# Patient Record
Sex: Female | Born: 1945 | Race: White | Hispanic: No | Smoking: Never smoker
Health system: Southern US, Community
[De-identification: ages and names within clinical notes are randomized; demographics above are authoritative.]

## PROBLEM LIST (undated history)

## (undated) DIAGNOSIS — Z8719 Personal history of other diseases of the digestive system: Secondary | ICD-10-CM

## (undated) DIAGNOSIS — F329 Major depressive disorder, single episode, unspecified: Secondary | ICD-10-CM

## (undated) DIAGNOSIS — I10 Essential (primary) hypertension: Secondary | ICD-10-CM

## (undated) DIAGNOSIS — M1990 Unspecified osteoarthritis, unspecified site: Secondary | ICD-10-CM

## (undated) DIAGNOSIS — F32A Depression, unspecified: Secondary | ICD-10-CM

## (undated) HISTORY — PX: ABDOMINAL HYSTERECTOMY: SHX81

## (undated) HISTORY — PX: TONSILLECTOMY: SUR1361

## (undated) HISTORY — PX: APPENDECTOMY: SHX54

---

## 1999-05-08 HISTORY — PX: OTHER SURGICAL HISTORY: SHX169

## 2016-09-21 ENCOUNTER — Ambulatory Visit: Payer: Self-pay | Admitting: Physician Assistant

## 2016-10-22 ENCOUNTER — Encounter (HOSPITAL_COMMUNITY)
Admission: RE | Admit: 2016-10-22 | Discharge: 2016-10-22 | Disposition: A | Discharge status: Home / Self Care | Payer: Medicare HMO | Source: Ambulatory Visit | Attending: Orthopedic Surgery | Admitting: Orthopedic Surgery

## 2016-10-22 ENCOUNTER — Encounter (HOSPITAL_COMMUNITY): Payer: Self-pay

## 2016-10-22 DIAGNOSIS — M48061 Spinal stenosis, lumbar region without neurogenic claudication: Secondary | ICD-10-CM | POA: Diagnosis not present

## 2016-10-22 DIAGNOSIS — F329 Major depressive disorder, single episode, unspecified: Secondary | ICD-10-CM | POA: Diagnosis not present

## 2016-10-22 DIAGNOSIS — K449 Diaphragmatic hernia without obstruction or gangrene: Secondary | ICD-10-CM | POA: Diagnosis not present

## 2016-10-22 DIAGNOSIS — M5136 Other intervertebral disc degeneration, lumbar region: Secondary | ICD-10-CM | POA: Insufficient documentation

## 2016-10-22 DIAGNOSIS — Z01812 Encounter for preprocedural laboratory examination: Secondary | ICD-10-CM | POA: Insufficient documentation

## 2016-10-22 DIAGNOSIS — I10 Essential (primary) hypertension: Secondary | ICD-10-CM | POA: Insufficient documentation

## 2016-10-22 HISTORY — DX: Major depressive disorder, single episode, unspecified: F32.9

## 2016-10-22 HISTORY — DX: Depression, unspecified: F32.A

## 2016-10-22 HISTORY — DX: Unspecified osteoarthritis, unspecified site: M19.90

## 2016-10-22 HISTORY — DX: Essential (primary) hypertension: I10

## 2016-10-22 HISTORY — DX: Personal history of other diseases of the digestive system: Z87.19

## 2016-10-22 LAB — BASIC METABOLIC PANEL
ANION GAP: 8 (ref 5–15)
BUN: 18 mg/dL (ref 6–20)
CALCIUM: 9.4 mg/dL (ref 8.9–10.3)
CHLORIDE: 108 mmol/L (ref 101–111)
CO2: 25 mmol/L (ref 22–32)
CREATININE: 1.07 mg/dL — AB (ref 0.44–1.00)
GFR calc non Af Amer: 51 mL/min — ABNORMAL LOW (ref >60)
GFR, EST AFRICAN AMERICAN: 60 mL/min — AB (ref >60)
GLUCOSE: 119 mg/dL — AB (ref 65–99)
Potassium: 4.2 mmol/L (ref 3.5–5.1)
Sodium: 141 mmol/L (ref 135–145)

## 2016-10-22 LAB — CBC
HCT: 42.6 % (ref 36.0–46.0)
HEMOGLOBIN: 13.8 g/dL (ref 12.0–15.0)
MCH: 32.1 pg (ref 26.0–34.0)
MCHC: 32.4 g/dL (ref 30.0–36.0)
MCV: 99.1 fL (ref 78.0–100.0)
Platelets: 237 10*3/uL (ref 150–400)
RBC: 4.3 MIL/uL (ref 3.87–5.11)
RDW: 14.2 % (ref 11.5–15.5)
WBC: 7.4 10*3/uL (ref 4.0–10.5)

## 2016-10-22 LAB — TYPE AND SCREEN
ABO/RH(D): A POS
Antibody Screen: NEGATIVE

## 2016-10-22 LAB — SURGICAL PCR SCREEN
MRSA, PCR: NEGATIVE
Staphylococcus aureus: NEGATIVE

## 2016-10-22 LAB — ABO/RH: ABO/RH(D): A POS

## 2016-10-22 NOTE — Pre-Procedure Instructions (Addendum)
Sierra Jones  10/22/2016      [facility] - [place] - [id number] [place] [id number] Phone: [phone] Fax: [phone]    Your procedure is scheduled on 10/31/16 .  [facility] Admitting at 530 A.M.  Call this number if you have problems the morning of surgery:  [phone]   Remember:  Do not eat food or drink liquids after midnight.  Take these medicines the morning of surgery with A SIP OF WATER     Atenolol, fluoxetine(prozac), lyrica  STOP all herbel meds, nsaids (aleve,naproxen,advil,ibuprofen) prior to surgery starting 10/24/16 including all vitamins/supplements,aspirin   Do not wear jewelry, make-up or nail polish.  Do not wear lotions, powders, or perfumes, or deoderant.  Do not shave 48 hours prior to surgery.  Men may shave face and neck.  Do not bring valuables to the hospital.  [facility] is not responsible for any belongings or valuables.  Contacts, dentures or bridgework may not be worn into surgery.  Leave your suitcase in the car.  After surgery it may be brought to your room.  For patients admitted to the hospital, discharge time will be determined by your treatment team.  Patients discharged the day of surgery will not be allowed to drive home.   Special instructions:   Special Instructions: [facility] - Preparing for Surgery  Before surgery, you can play an important role.  Because skin is not sterile, your skin needs to be as free of germs as possible.  You can reduce the number of germs on you skin by washing with CHG (chlorahexidine gluconate) soap before surgery.  CHG is an antiseptic cleaner which kills germs and bonds with the skin to continue killing germs even after washing.  Please DO NOT use if you have an allergy to CHG or antibacterial soaps.  If your skin becomes reddened/irritated stop using the CHG and inform your nurse when you arrive at Short Stay.  Do not shave  (including legs and underarms) for at least 48 hours prior to the first CHG shower.  You may shave your face.  Please follow these instructions carefully:   1.  Shower with CHG Soap the night before surgery and the morning of Surgery.  2.  If you choose to wash your hair, wash your hair first as usual with your normal shampoo.  3.  After you shampoo, rinse your hair and body thoroughly to remove the Shampoo.  4.  Use CHG as you would any other liquid soap.  You can apply chg directly  to the skin and wash gently with scrungie or a clean washcloth.  5.  Apply the CHG Soap to your body ONLY FROM THE NECK DOWN.  Do not use on open wounds or open sores.  Avoid contact with your eyes ears, mouth and genitals (private parts).  Wash genitals (private parts)       with your normal soap.  6.  Wash thoroughly, paying special attention to the area where your surgery will be performed.  7.  Thoroughly rinse your body with warm water from the neck down.  8.  DO NOT shower/wash with your normal soap after using and rinsing off the CHG Soap.  9.  Pat yourself dry with a clean towel.            10.  Wear clean pajamas.            11.  Place clean sheets on your bed the night of your first shower and do not sleep with pets.  Day of Surgery  Do not apply any lotions/deodorants the morning of surgery.  Please wear clean clothes to the hospital/surgery center.  Please read over the  fact sheets that you were given.

## 2016-10-22 NOTE — Progress Notes (Signed)
req'd ekg done at pcp recently-[facility]

## 2016-10-31 ENCOUNTER — Encounter (HOSPITAL_COMMUNITY)
Admission: RE | Disposition: A | Discharge status: Home / Self Care | Payer: Self-pay | Source: Ambulatory Visit | Attending: Orthopedic Surgery

## 2016-10-31 ENCOUNTER — Inpatient Hospital Stay (HOSPITAL_COMMUNITY): Payer: Medicare HMO | Admitting: Certified Registered Nurse Anesthetist

## 2016-10-31 ENCOUNTER — Encounter (HOSPITAL_COMMUNITY): Payer: Self-pay | Admitting: Urology

## 2016-10-31 ENCOUNTER — Inpatient Hospital Stay (HOSPITAL_COMMUNITY): Payer: Medicare HMO

## 2016-10-31 ENCOUNTER — Inpatient Hospital Stay (HOSPITAL_COMMUNITY)
Admission: RE | Admit: 2016-10-31 | Discharge: 2016-11-02 | DRG: 460 | Disposition: A | Discharge status: Home / Self Care | Payer: Medicare HMO | Source: Ambulatory Visit | Attending: Orthopedic Surgery | Admitting: Orthopedic Surgery

## 2016-10-31 DIAGNOSIS — Z809 Family history of malignant neoplasm, unspecified: Secondary | ICD-10-CM | POA: Diagnosis not present

## 2016-10-31 DIAGNOSIS — M533 Sacrococcygeal disorders, not elsewhere classified: Secondary | ICD-10-CM | POA: Diagnosis present

## 2016-10-31 DIAGNOSIS — Z9889 Other specified postprocedural states: Secondary | ICD-10-CM

## 2016-10-31 DIAGNOSIS — M109 Gout, unspecified: Secondary | ICD-10-CM | POA: Diagnosis present

## 2016-10-31 DIAGNOSIS — M48062 Spinal stenosis, lumbar region with neurogenic claudication: Principal | ICD-10-CM | POA: Diagnosis present

## 2016-10-31 DIAGNOSIS — Z823 Family history of stroke: Secondary | ICD-10-CM | POA: Diagnosis not present

## 2016-10-31 DIAGNOSIS — I10 Essential (primary) hypertension: Secondary | ICD-10-CM | POA: Diagnosis present

## 2016-10-31 DIAGNOSIS — Z79891 Long term (current) use of opiate analgesic: Secondary | ICD-10-CM | POA: Diagnosis not present

## 2016-10-31 DIAGNOSIS — M549 Dorsalgia, unspecified: Secondary | ICD-10-CM | POA: Diagnosis present

## 2016-10-31 DIAGNOSIS — M5116 Intervertebral disc disorders with radiculopathy, lumbar region: Secondary | ICD-10-CM | POA: Diagnosis present

## 2016-10-31 DIAGNOSIS — M4316 Spondylolisthesis, lumbar region: Secondary | ICD-10-CM | POA: Diagnosis present

## 2016-10-31 DIAGNOSIS — Z8249 Family history of ischemic heart disease and other diseases of the circulatory system: Secondary | ICD-10-CM

## 2016-10-31 DIAGNOSIS — Z818 Family history of other mental and behavioral disorders: Secondary | ICD-10-CM | POA: Diagnosis not present

## 2016-10-31 DIAGNOSIS — M797 Fibromyalgia: Secondary | ICD-10-CM | POA: Diagnosis present

## 2016-10-31 DIAGNOSIS — F329 Major depressive disorder, single episode, unspecified: Secondary | ICD-10-CM | POA: Diagnosis present

## 2016-10-31 DIAGNOSIS — M4326 Fusion of spine, lumbar region: Secondary | ICD-10-CM

## 2016-10-31 DIAGNOSIS — M7062 Trochanteric bursitis, left hip: Secondary | ICD-10-CM | POA: Diagnosis present

## 2016-10-31 DIAGNOSIS — Z419 Encounter for procedure for purposes other than remedying health state, unspecified: Secondary | ICD-10-CM

## 2016-10-31 HISTORY — PX: TRANSFORAMINAL LUMBAR INTERBODY FUSION (TLIF) WITH PEDICLE SCREW FIXATION 1 LEVEL: SHX6141

## 2016-10-31 HISTORY — PX: LUMBAR LAMINECTOMY/DECOMPRESSION MICRODISCECTOMY: SHX5026

## 2016-10-31 SURGERY — TRANSFORAMINAL LUMBAR INTERBODY FUSION (TLIF) WITH PEDICLE SCREW FIXATION 1 LEVEL
Anesthesia: General | Site: Spine Lumbar

## 2016-10-31 MED ORDER — HEMOSTATIC AGENTS (NO CHARGE) OPTIME
TOPICAL | Status: DC | PRN
  Administered 2016-10-31: 1 via TOPICAL

## 2016-10-31 MED ORDER — SODIUM CHLORIDE 0.9 % IV SOLN: 250.0000 mL | INTRAVENOUS | Status: DC

## 2016-10-31 MED ORDER — SUGAMMADEX SODIUM 200 MG/2ML IV SOLN
INTRAVENOUS | Status: DC | PRN
  Administered 2016-10-31: 200 mg via INTRAVENOUS

## 2016-10-31 MED ORDER — PHENOL 1.4 % MT LIQD: 1.0000 | OROMUCOSAL | Status: DC | PRN

## 2016-10-31 MED ORDER — POLYETHYLENE GLYCOL 3350 17 G PO PACK: 17.0000 g | PACK | Freq: Every day | ORAL | Status: DC | PRN

## 2016-10-31 MED ORDER — THROMBIN 20000 UNITS EX SOLR
CUTANEOUS | Status: AC
  Filled 2016-10-31: qty 20000

## 2016-10-31 MED ORDER — LIDOCAINE HCL (CARDIAC) 20 MG/ML IV SOLN
INTRAVENOUS | Status: DC | PRN
  Administered 2016-10-31: 100 mg via INTRATRACHEAL

## 2016-10-31 MED ORDER — PROPOFOL 10 MG/ML IV BOLUS
INTRAVENOUS | Status: DC | PRN
  Administered 2016-10-31: 150 mg via INTRAVENOUS
  Administered 2016-10-31: 50 mg via INTRAVENOUS

## 2016-10-31 MED ORDER — LACTATED RINGERS IV SOLN: INTRAVENOUS | Status: DC

## 2016-10-31 MED ORDER — ONDANSETRON HCL 4 MG/2ML IJ SOLN: 4.0000 mg | Freq: Four times a day (QID) | INTRAMUSCULAR | Status: DC | PRN

## 2016-10-31 MED ORDER — PROPOFOL 500 MG/50ML IV EMUL
INTRAVENOUS | Status: DC | PRN
  Administered 2016-10-31: 75 ug/kg/min via INTRAVENOUS
  Administered 2016-10-31: 10:00:00 via INTRAVENOUS

## 2016-10-31 MED ORDER — ONDANSETRON HCL 4 MG PO TABS: 4.0000 mg | ORAL_TABLET | Freq: Four times a day (QID) | ORAL | Status: DC | PRN

## 2016-10-31 MED ORDER — ARTIFICIAL TEARS OPHTHALMIC OINT
TOPICAL_OINTMENT | OPHTHALMIC | Status: DC | PRN
  Administered 2016-10-31: 1 via OPHTHALMIC

## 2016-10-31 MED ORDER — SUCCINYLCHOLINE CHLORIDE 20 MG/ML IJ SOLN
INTRAMUSCULAR | Status: DC | PRN
  Administered 2016-10-31: 120 mg via INTRAVENOUS

## 2016-10-31 MED ORDER — FENTANYL CITRATE (PF) 250 MCG/5ML IJ SOLN
INTRAMUSCULAR | Status: AC
  Filled 2016-10-31: qty 5

## 2016-10-31 MED ORDER — MORPHINE SULFATE (PF) 4 MG/ML IV SOLN: 2.0000 mg | INTRAVENOUS | Status: AC | PRN

## 2016-10-31 MED ORDER — CEFAZOLIN SODIUM-DEXTROSE 2-4 GM/100ML-% IV SOLN
2.0000 g | INTRAVENOUS | Status: AC
  Administered 2016-10-31: 2 g via INTRAVENOUS
  Filled 2016-10-31: qty 100

## 2016-10-31 MED ORDER — DEXAMETHASONE SODIUM PHOSPHATE 10 MG/ML IJ SOLN
INTRAMUSCULAR | Status: AC
  Filled 2016-10-31: qty 1

## 2016-10-31 MED ORDER — MAGNESIUM CITRATE PO SOLN: 0.5000 | Freq: Once | ORAL | Status: DC | PRN

## 2016-10-31 MED ORDER — PHENYLEPHRINE HCL 10 MG/ML IJ SOLN
INTRAMUSCULAR | Status: DC | PRN
  Administered 2016-10-31: 120 ug via INTRAVENOUS

## 2016-10-31 MED ORDER — METHOCARBAMOL 500 MG PO TABS
500.0000 mg | ORAL_TABLET | Freq: Four times a day (QID) | ORAL | Status: DC | PRN
  Administered 2016-10-31 – 2016-11-02 (×3): 500 mg via ORAL
  Filled 2016-10-31 (×3): qty 1

## 2016-10-31 MED ORDER — ONDANSETRON HCL 4 MG/2ML IJ SOLN
INTRAMUSCULAR | Status: DC | PRN
  Administered 2016-10-31: 4 mg via INTRAVENOUS

## 2016-10-31 MED ORDER — ACETAMINOPHEN 325 MG PO TABS: 650.0000 mg | ORAL_TABLET | ORAL | Status: DC | PRN

## 2016-10-31 MED ORDER — MENTHOL 3 MG MT LOZG: 1.0000 | LOZENGE | OROMUCOSAL | Status: DC | PRN

## 2016-10-31 MED ORDER — FENTANYL CITRATE (PF) 100 MCG/2ML IJ SOLN
25.0000 ug | INTRAMUSCULAR | Status: DC | PRN
  Administered 2016-10-31: 50 ug via INTRAVENOUS

## 2016-10-31 MED ORDER — SUCCINYLCHOLINE CHLORIDE 200 MG/10ML IV SOSY
PREFILLED_SYRINGE | INTRAVENOUS | Status: AC
  Filled 2016-10-31: qty 10

## 2016-10-31 MED ORDER — ARTIFICIAL TEARS OPHTHALMIC OINT
TOPICAL_OINTMENT | OPHTHALMIC | Status: AC
  Filled 2016-10-31: qty 3.5

## 2016-10-31 MED ORDER — METHOCARBAMOL 1000 MG/10ML IJ SOLN
500.0000 mg | Freq: Four times a day (QID) | INTRAMUSCULAR | Status: DC | PRN
  Filled 2016-10-31: qty 5

## 2016-10-31 MED ORDER — ROCURONIUM BROMIDE 10 MG/ML (PF) SYRINGE
PREFILLED_SYRINGE | INTRAVENOUS | Status: AC
  Filled 2016-10-31: qty 5

## 2016-10-31 MED ORDER — FLUOXETINE HCL 20 MG PO CAPS
20.0000 mg | ORAL_CAPSULE | Freq: Two times a day (BID) | ORAL | Status: DC
  Administered 2016-10-31 – 2016-11-02 (×4): 20 mg via ORAL
  Filled 2016-10-31 (×6): qty 1

## 2016-10-31 MED ORDER — OXYCODONE HCL 5 MG PO TABS
ORAL_TABLET | ORAL | Status: AC
  Administered 2016-10-31: 10 mg via ORAL
  Filled 2016-10-31: qty 2

## 2016-10-31 MED ORDER — ATENOLOL 50 MG PO TABS
50.0000 mg | ORAL_TABLET | Freq: Every day | ORAL | Status: DC
  Administered 2016-11-01 – 2016-11-02 (×2): 50 mg via ORAL
  Filled 2016-10-31 (×2): qty 1

## 2016-10-31 MED ORDER — BUPIVACAINE-EPINEPHRINE 0.25% -1:200000 IJ SOLN
INTRAMUSCULAR | Status: DC | PRN
  Administered 2016-10-31: 10 mL

## 2016-10-31 MED ORDER — FENTANYL CITRATE (PF) 100 MCG/2ML IJ SOLN
INTRAMUSCULAR | Status: AC
  Filled 2016-10-31: qty 2

## 2016-10-31 MED ORDER — EPHEDRINE SULFATE 50 MG/ML IJ SOLN
INTRAMUSCULAR | Status: DC | PRN
  Administered 2016-10-31 (×3): 10 mg via INTRAVENOUS
  Administered 2016-10-31: 5 mg via INTRAVENOUS
  Administered 2016-10-31: 15 mg via INTRAVENOUS

## 2016-10-31 MED ORDER — ALBUMIN HUMAN 5 % IV SOLN
INTRAVENOUS | Status: DC | PRN
  Administered 2016-10-31 (×2): via INTRAVENOUS

## 2016-10-31 MED ORDER — SODIUM CHLORIDE 0.9% FLUSH: 3.0000 mL | INTRAVENOUS | Status: DC | PRN

## 2016-10-31 MED ORDER — ONDANSETRON HCL 4 MG/2ML IJ SOLN
INTRAMUSCULAR | Status: AC
  Filled 2016-10-31: qty 2

## 2016-10-31 MED ORDER — HYDROMORPHONE HCL 1 MG/ML IJ SOLN
INTRAMUSCULAR | Status: AC
  Filled 2016-10-31: qty 0.5

## 2016-10-31 MED ORDER — DEXAMETHASONE SODIUM PHOSPHATE 4 MG/ML IJ SOLN
4.0000 mg | Freq: Four times a day (QID) | INTRAMUSCULAR | Status: AC
  Administered 2016-10-31: 4 mg via INTRAVENOUS
  Filled 2016-10-31: qty 1

## 2016-10-31 MED ORDER — ACETAMINOPHEN 10 MG/ML IV SOLN
INTRAVENOUS | Status: DC | PRN
Start: 2016-10-31 — End: 2016-10-31
  Administered 2016-10-31: 1000 mg via INTRAVENOUS

## 2016-10-31 MED ORDER — ALBUTEROL SULFATE HFA 108 (90 BASE) MCG/ACT IN AERS
INHALATION_SPRAY | RESPIRATORY_TRACT | Status: DC | PRN
  Administered 2016-10-31: 2 via RESPIRATORY_TRACT

## 2016-10-31 MED ORDER — ACETAMINOPHEN 10 MG/ML IV SOLN
INTRAVENOUS | Status: AC
  Filled 2016-10-31: qty 100

## 2016-10-31 MED ORDER — FENTANYL CITRATE (PF) 250 MCG/5ML IJ SOLN
INTRAMUSCULAR | Status: DC | PRN
  Administered 2016-10-31: 25 ug via INTRAVENOUS
  Administered 2016-10-31 (×2): 100 ug via INTRAVENOUS
  Administered 2016-10-31: 25 ug via INTRAVENOUS

## 2016-10-31 MED ORDER — ACETAMINOPHEN 650 MG RE SUPP: 650.0000 mg | RECTAL | Status: DC | PRN

## 2016-10-31 MED ORDER — PROPOFOL 1000 MG/100ML IV EMUL
INTRAVENOUS | Status: AC
  Filled 2016-10-31: qty 100

## 2016-10-31 MED ORDER — SUGAMMADEX SODIUM 200 MG/2ML IV SOLN
INTRAVENOUS | Status: AC
  Filled 2016-10-31: qty 2

## 2016-10-31 MED ORDER — DEXAMETHASONE 4 MG PO TABS
4.0000 mg | ORAL_TABLET | Freq: Four times a day (QID) | ORAL | Status: AC
  Administered 2016-10-31 – 2016-11-01 (×2): 4 mg via ORAL
  Filled 2016-10-31 (×2): qty 1

## 2016-10-31 MED ORDER — GLYCOPYRROLATE 0.2 MG/ML IJ SOLN
INTRAMUSCULAR | Status: DC | PRN
  Administered 2016-10-31: 0.2 mg via INTRAVENOUS

## 2016-10-31 MED ORDER — EPHEDRINE 5 MG/ML INJ
INTRAVENOUS | Status: AC
  Filled 2016-10-31: qty 10

## 2016-10-31 MED ORDER — 0.9 % SODIUM CHLORIDE (POUR BTL) OPTIME
TOPICAL | Status: DC | PRN
  Administered 2016-10-31: 1000 mL

## 2016-10-31 MED ORDER — THROMBIN 20000 UNITS EX SOLR
OROMUCOSAL | Status: DC | PRN
  Administered 2016-10-31: 09:00:00 via TOPICAL

## 2016-10-31 MED ORDER — LACTATED RINGERS IV SOLN
INTRAVENOUS | Status: DC | PRN
  Administered 2016-10-31 (×3): via INTRAVENOUS

## 2016-10-31 MED ORDER — CEFAZOLIN SODIUM-DEXTROSE 2-4 GM/100ML-% IV SOLN
2.0000 g | Freq: Three times a day (TID) | INTRAVENOUS | Status: AC
  Administered 2016-10-31 – 2016-11-01 (×2): 2 g via INTRAVENOUS
  Filled 2016-10-31: qty 100

## 2016-10-31 MED ORDER — PHENYLEPHRINE HCL 10 MG/ML IJ SOLN
INTRAVENOUS | Status: DC | PRN
  Administered 2016-10-31: 25 ug/min via INTRAVENOUS

## 2016-10-31 MED ORDER — PHENYLEPHRINE 40 MCG/ML (10ML) SYRINGE FOR IV PUSH (FOR BLOOD PRESSURE SUPPORT)
PREFILLED_SYRINGE | INTRAVENOUS | Status: AC
  Filled 2016-10-31: qty 10

## 2016-10-31 MED ORDER — ROCURONIUM BROMIDE 100 MG/10ML IV SOLN
INTRAVENOUS | Status: DC | PRN
  Administered 2016-10-31: 40 mg via INTRAVENOUS

## 2016-10-31 MED ORDER — LIDOCAINE 2% (20 MG/ML) 5 ML SYRINGE
INTRAMUSCULAR | Status: AC
  Filled 2016-10-31: qty 5

## 2016-10-31 MED ORDER — SODIUM CHLORIDE 0.9% FLUSH: 3.0000 mL | Freq: Two times a day (BID) | INTRAVENOUS | Status: DC

## 2016-10-31 MED ORDER — MIDAZOLAM HCL 2 MG/2ML IJ SOLN
INTRAMUSCULAR | Status: DC | PRN
  Administered 2016-10-31 (×2): 1 mg via INTRAVENOUS

## 2016-10-31 MED ORDER — OXYCODONE HCL 5 MG PO TABS
10.0000 mg | ORAL_TABLET | ORAL | Status: DC | PRN
  Administered 2016-10-31 – 2016-11-02 (×6): 10 mg via ORAL
  Filled 2016-10-31 (×6): qty 2

## 2016-10-31 MED ORDER — BUPIVACAINE-EPINEPHRINE (PF) 0.25% -1:200000 IJ SOLN
INTRAMUSCULAR | Status: AC
  Filled 2016-10-31: qty 30

## 2016-10-31 MED ORDER — ALBUTEROL SULFATE HFA 108 (90 BASE) MCG/ACT IN AERS
INHALATION_SPRAY | RESPIRATORY_TRACT | Status: AC
  Filled 2016-10-31: qty 6.7

## 2016-10-31 MED ORDER — MIDAZOLAM HCL 2 MG/2ML IJ SOLN
INTRAMUSCULAR | Status: AC
  Filled 2016-10-31: qty 2

## 2016-10-31 MED ORDER — DEXAMETHASONE SODIUM PHOSPHATE 10 MG/ML IJ SOLN
INTRAMUSCULAR | Status: DC | PRN
  Administered 2016-10-31: 10 mg via INTRAVENOUS

## 2016-10-31 MED ORDER — TRIAMTERENE-HCTZ 37.5-25 MG PO TABS
1.0000 | ORAL_TABLET | Freq: Every day | ORAL | Status: DC
  Administered 2016-11-01 – 2016-11-02 (×2): 1 via ORAL
  Filled 2016-10-31 (×2): qty 1

## 2016-10-31 MED ORDER — HYDROMORPHONE HCL 1 MG/ML IJ SOLN
INTRAMUSCULAR | Status: DC | PRN
  Administered 2016-10-31: 0.5 mg via INTRAVENOUS

## 2016-10-31 SURGICAL SUPPLY — 85 items
BUR EGG ELITE 4.0 (BURR) IMPLANT
BUR EGG ELITE 4.0MM (BURR)
BUR MATCHSTICK NEURO 3.0 LAGG (BURR) IMPLANT
CAGE TLIF NANOLOCK 11 (Cage) ×2 IMPLANT
CAGE TLIF NANOLOCK 11MM (Cage) ×1 IMPLANT
CANISTER SUCT 3000ML PPV (MISCELLANEOUS) ×3 IMPLANT
CLIP NEUROVISION LG (CLIP) ×3 IMPLANT
CLOSURE STERI-STRIP 1/2X4 (GAUZE/BANDAGES/DRESSINGS) ×1
CLSR STERI-STRIP ANTIMIC 1/2X4 (GAUZE/BANDAGES/DRESSINGS) ×2 IMPLANT
CORDS BIPOLAR (ELECTRODE) ×3 IMPLANT
COVER SURGICAL LIGHT HANDLE (MISCELLANEOUS) ×3 IMPLANT
DRAIN CHANNEL 15F RND FF W/TCR (WOUND CARE) IMPLANT
DRAPE POUCH INSTRU U-SHP 10X18 (DRAPES) ×3 IMPLANT
DRAPE SURG 17X23 STRL (DRAPES) ×3 IMPLANT
DRAPE U-SHAPE 47X51 STRL (DRAPES) ×3 IMPLANT
DRSG AQUACEL AG ADV 3.5X 4 (GAUZE/BANDAGES/DRESSINGS) IMPLANT
DRSG AQUACEL AG ADV 3.5X 6 (GAUZE/BANDAGES/DRESSINGS) ×3 IMPLANT
DRSG OPSITE POSTOP 3X4 (GAUZE/BANDAGES/DRESSINGS) ×3 IMPLANT
DRSG OPSITE POSTOP 4X6 (GAUZE/BANDAGES/DRESSINGS) ×3 IMPLANT
DURAPREP 26ML APPLICATOR (WOUND CARE) ×3 IMPLANT
ELECT BLADE 4.0 EZ CLEAN MEGAD (MISCELLANEOUS) ×3
ELECT CAUTERY BLADE 6.4 (BLADE) ×3 IMPLANT
ELECT PENCIL ROCKER SW 15FT (MISCELLANEOUS) ×3 IMPLANT
ELECT REM PT RETURN 9FT ADLT (ELECTROSURGICAL) ×3
ELECTRODE BLDE 4.0 EZ CLN MEGD (MISCELLANEOUS) ×1 IMPLANT
ELECTRODE REM PT RTRN 9FT ADLT (ELECTROSURGICAL) ×1 IMPLANT
EVACUATOR SILICONE 100CC (DRAIN) IMPLANT
GLOVE BIO SURGEON STRL SZ 6.5 (GLOVE) ×4 IMPLANT
GLOVE BIO SURGEONS STRL SZ 6.5 (GLOVE) ×2
GLOVE BIOGEL PI IND STRL 6.5 (GLOVE) ×2 IMPLANT
GLOVE BIOGEL PI IND STRL 8.5 (GLOVE) ×1 IMPLANT
GLOVE BIOGEL PI INDICATOR 6.5 (GLOVE) ×4
GLOVE BIOGEL PI INDICATOR 8.5 (GLOVE) ×2
GLOVE SS BIOGEL STRL SZ 8.5 (GLOVE) ×1 IMPLANT
GLOVE SUPERSENSE BIOGEL SZ 8.5 (GLOVE) ×2
GOWN STRL REUS W/TWL 2XL LVL3 (GOWN DISPOSABLE) ×6 IMPLANT
GUIDEWIRE NITINOL BEVEL TIP (WIRE) ×12 IMPLANT
KIT BASIN OR (CUSTOM PROCEDURE TRAY) ×3 IMPLANT
KIT DILATOR XLIF 5 (KITS) ×1 IMPLANT
KIT ROOM TURNOVER OR (KITS) ×3 IMPLANT
KIT XLIF (KITS) ×2
LIGHT SOURCE ANGLE TIP STR 7FT (MISCELLANEOUS) ×3 IMPLANT
MODULE NVM5 NEXT GEN EMG (NEEDLE) ×3 IMPLANT
NEEDLE 22X1 1/2 (OR ONLY) (NEEDLE) ×3 IMPLANT
NEEDLE SPNL 18GX3.5 QUINCKE PK (NEEDLE) ×6 IMPLANT
NS IRRIG 1000ML POUR BTL (IV SOLUTION) ×3 IMPLANT
PACK LAMINECTOMY ORTHO (CUSTOM PROCEDURE TRAY) ×3 IMPLANT
PACK UNIVERSAL I (CUSTOM PROCEDURE TRAY) ×3 IMPLANT
PAD ARMBOARD 7.5X6 YLW CONV (MISCELLANEOUS) ×6 IMPLANT
PATTIES SURGICAL .5 X.5 (GAUZE/BANDAGES/DRESSINGS) ×3 IMPLANT
PATTIES SURGICAL .5 X1 (DISPOSABLE) ×3 IMPLANT
PROBE BALL TIP NVM5 SNG USE (BALLOONS) ×3 IMPLANT
PUTTY DBX 1CC (Putty) ×3 IMPLANT
PUTTY DBX 1CC DEPUY (Putty) ×1 IMPLANT
REDUCTION EXT RELINE MAS MOD (Neuro Prosthesis/Implant) ×6 IMPLANT
ROD RELINE MAS LORD 5.5X45MM (Rod) ×6 IMPLANT
SCREW LOCK RELINE 5.5 TULIP (Screw) ×12 IMPLANT
SCREW RELINE MAS POLY 6.5X40MM (Screw) ×3 IMPLANT
SCREW RELINE RED 6.5X45MM POLY (Screw) ×3 IMPLANT
SCREW SHANK MAS MOD 6.5X40MM (Screw) ×3 IMPLANT
SCREW SHANK RELINE 6.5X45MM 2C (Screw) ×3 IMPLANT
SHEET CONFORM 45LX20WX5H (Bone Implant) ×3 IMPLANT
SPONGE SURGIFOAM ABS GEL 100 (HEMOSTASIS) ×3 IMPLANT
SURGIFLO W/THROMBIN 8M KIT (HEMOSTASIS) IMPLANT
SUT BONE WAX W31G (SUTURE) ×3 IMPLANT
SUT MON AB 3-0 SH 27 (SUTURE) ×2
SUT MON AB 3-0 SH27 (SUTURE) ×1 IMPLANT
SUT STRATAFIX 1PDS 45CM VIOLET (SUTURE) IMPLANT
SUT STRATAFIX MNCRL+ 3-0 PS-2 (SUTURE)
SUT STRATAFIX MONOCRYL 3-0 (SUTURE)
SUT STRATAFIX SPIRAL + 2-0 (SUTURE) IMPLANT
SUT VIC AB 0 CT1 27 (SUTURE) ×2
SUT VIC AB 0 CT1 27XBRD ANBCTR (SUTURE) ×1 IMPLANT
SUT VIC AB 1 CT1 18XCR BRD 8 (SUTURE) ×1 IMPLANT
SUT VIC AB 1 CT1 8-18 (SUTURE) ×2
SUT VIC AB 1 CTX 36 (SUTURE)
SUT VIC AB 1 CTX36XBRD ANBCTR (SUTURE) IMPLANT
SUT VIC AB 2-0 CT1 18 (SUTURE) IMPLANT
SUTURE STRATFX MNCRL+ 3-0 PS-2 (SUTURE) IMPLANT
SYR BULB IRRIGATION 50ML (SYRINGE) ×3 IMPLANT
SYR CONTROL 10ML LL (SYRINGE) ×3 IMPLANT
TOWEL OR 17X24 6PK STRL BLUE (TOWEL DISPOSABLE) ×3 IMPLANT
TOWEL OR 17X26 10 PK STRL BLUE (TOWEL DISPOSABLE) ×3 IMPLANT
WATER STERILE IRR 1000ML POUR (IV SOLUTION) ×3 IMPLANT
YANKAUER SUCT BULB TIP NO VENT (SUCTIONS) ×3 IMPLANT

## 2016-10-31 NOTE — Transfer of Care (Signed)
Immediate Anesthesia Transfer of Care Note  Patient: Sierra Jones  Procedure(s) Performed: Procedure(s) with comments: TRANSFORAMINAL LUMBAR INTERBODY FUSION (TLIF) L4-5 (N/A) - Requests 4 hrs LUMBAR L2-3 LAMINECTOMY/DECOMPRESSION MICRODISCECTOMY 1 LEVEL (N/A)  Patient Location: PACU  Anesthesia Type:General  Level of Consciousness: awake, alert  and patient cooperative  Airway & Oxygen Therapy: Patient Spontanous Breathing and Patient connected to nasal cannula oxygen  Post-op Assessment: Report given to RN, Post -op Vital signs reviewed and stable, Patient moving all extremities X 4 and Patient able to stick tongue midline  Post vital signs: Reviewed and stable  Last Vitals:  Vitals:   10/31/16 0636  BP: (!) 142/65  Pulse: 60  Resp: 17  Temp: 37 C    Last Pain:  Vitals:   10/31/16 0636  TempSrc: Oral  PainSc:          Complications: No apparent anesthesia complications

## 2016-10-31 NOTE — Anesthesia Postprocedure Evaluation (Signed)
Anesthesia Post Note  Patient: Sierra Jones  Procedure(s) Performed: Procedure(s) (LRB): TRANSFORAMINAL LUMBAR INTERBODY FUSION (TLIF) L4-5 (N/A) LUMBAR L2-3 LAMINECTOMY/DECOMPRESSION MICRODISCECTOMY 1 LEVEL (N/A)     Patient location during evaluation: PACU Anesthesia Type: General Level of consciousness: awake, awake and alert and oriented Pain management: pain level controlled Vital Signs Assessment: post-procedure vital signs reviewed and stable Respiratory status: spontaneous breathing, nonlabored ventilation and respiratory function stable Cardiovascular status: blood pressure returned to baseline Anesthetic complications: no    Last Vitals:  Vitals:   10/31/16 1915 10/31/16 2021  BP:  (!) 146/67  Pulse: 75 79  Resp: 17 18  Temp: 36.7 C 36.9 C    Last Pain:  Vitals:   10/31/16 2201  TempSrc:   PainSc: 6                  Emannuel Vise COKER

## 2016-10-31 NOTE — Brief Op Note (Signed)
10/31/2016  1:13 PM  PATIENT:  Sierra Jones  71 y.o. 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, L2-3 Spinal Stenosis, H&P,   PROCEDURE:  Procedure(s) with comments: TRANSFORAMINAL LUMBAR INTERBODY FUSION (TLIF) L4-5 (N/A) - Requests 4 hrs LUMBAR L2-3 LAMINECTOMY/DECOMPRESSION MICRODISCECTOMY 1 LEVEL (N/A)  SURGEON:  Surgeon(s) and Role:    Venita Lick* Storm Dulski, MD - Primary  PHYSICIAN ASSISTANT:   ASSISTANTS: Carmen Mayo   ANESTHESIA:   general  EBL:  Total I/O In: 2500 [I.V.:2000; IV Piggyback:500] Out: 500 [Urine:350; Blood:150]  BLOOD ADMINISTERED:none  DRAINS: none   LOCAL MEDICATIONS USED:  MARCAINE     SPECIMEN:  No Specimen  DISPOSITION OF SPECIMEN:  N/A  COUNTS:  YES  TOURNIQUET:  * No tourniquets in log *  DICTATION: .Dragon Dictation  PLAN OF CARE: Admit to inpatient   PATIENT DISPOSITION:  PACU - hemodynamically stable.  Assessment elected

## 2016-10-31 NOTE — Anesthesia Procedure Notes (Signed)
Procedure Name: Intubation Date/Time: 10/31/2016 8:20 AM Performed by: Rica Koyanagi Pre-anesthesia Checklist: Patient identified, Emergency Drugs available, Suction available and Patient being monitored Patient Re-evaluated:Patient Re-evaluated prior to inductionOxygen Delivery Method: Circle system utilized Preoxygenation: Pre-oxygenation with 100% oxygen Intubation Type: IV induction Ventilation: Mask ventilation without difficulty Laryngoscope Size: Mac and 3 Grade View: Grade III Tube type: Oral Tube size: 7.0 mm Number of attempts: 1 Placement Confirmation: ETT inserted through vocal cords under direct vision,  positive ETCO2 and breath sounds checked- equal and bilateral Secured at: 22 cm Tube secured with: Tape Dental Injury: Teeth and Oropharynx as per pre-operative assessment  Difficulty Due To: Difficulty was unanticipated Comments: Extremely anterior

## 2016-10-31 NOTE — Progress Notes (Signed)
Orthopedic Tech Progress Note Patient Details:  Sierra Jones Jul 07, 1945 [id number] Patient has brace. Patient ID: Sierra Jones, female   DOB: Jul 07, 1945, 71 y.o.   MRN: [id number]   Jennye MoccasinHughes, Raja Caputi Craig 10/31/2016, 7:44 PM

## 2016-10-31 NOTE — H&P (Signed)
History of Present Illness  The patient is a 71 year old female who comes in today for a preoperative History and Physical. The patient is scheduled for a TLIF L4-5, L2-3 decompression to be performed by Dr. Debria Garretahari D. Shon BatonBrooks, MD at [facility] on 10/31/2016 . Please see the hospital record for complete dictated history and physical.   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 Cerebrovascular Accident  Mother. Depression  Mother. Drug / Alcohol Addiction  Brother, Sister. First Degree Relatives  reported Heart Disease  Mother. Hypertension  Mother.  Social History  Tobacco / smoke exposure  06/26/2016: no Children  2 Current work status  retired Scientist, physiologicalxercise  Exercises never Living situation  live with caregiver Marital status  widowed Never consumed alcohol  06/26/2016: Never consumed alcohol No history of drug/alcohol rehab  Not under pain contract  Number of flights of stairs before winded  1 Tobacco use  Never smoker. 06/26/2016: uses less than 1/2 can(s) smokeless per week  Medication History  Dilaudid (4MG  Tablet, 1 (one) Tablet Oral three times daily, as needed, Taken starting 07/24/2016) Active. Acetaminophen-Codeine #3 (300-30MG  Tablet, Oral) Active. (RX'D BY PCP) TraZODone HCl (50MG  Tablet, Oral) Active. (QHS rX'D BY PCP) Gabapentin (300MG  Capsule, Oral) Active. (1 BID AND 2 QHS rx' PCP) FLUoxetine HCl (PMDD) (20MG  Capsule, Oral) Active. (1-2 QD) Atenolol (50MG  Tablet, Oral) Active. (QD) Triamterene-HCTZ (37.5-25MG  Tablet, Oral) Active.  (QD) Vitamin D (2000UNIT Capsule, Oral) Active. (BID) Multi Vitamin Daily (Oral) Active. Medications Reconciled  Pregnancy / Birth History  Pregnant  no  Past Surgical History  Appendectomy  Dilation and Curettage of Uterus  Hysterectomy  partial (non-cancerous)  Other Problems  Autoimmune disorder  child Depression  Fibromyalgia  Gout  High blood pressure  Unspecified Diagnosis   Physical Exam  General General Appearance-Not in acute distress. Orientation-Oriented X3. Build & Nutrition-Well nourished and Well developed.  Integumentary General Characteristics Surgical Scars - no surgical scar evidence of previous lumbar surgery. Lumbar Spine-Skin examination of the lumbar spine is without deformity, skin lesions, lacerations or abrasions.  Chest and Lung Exam Auscultation Breath sounds - Normal and Clear.  Cardiovascular Auscultation Rhythm - Regular rate and rhythm.  Abdomen Palpation/Percussion Palpation and Percussion of the abdomen reveal - Soft, Non Tender and No Rebound tenderness.  Peripheral Vascular Lower Extremity Palpation - Posterior tibial pulse - Bilateral - 2+. Dorsalis pedis pulse - Bilateral - 2+.  Neurologic Sensation Lower Extremity - Left - sensation is diminished in the lower extremity. Right - sensation is intact in the lower extremity. Reflexes Patellar Reflex - Bilateral - 2+. Achilles Reflex - Bilateral - 2+. Clonus - Bilateral - clonus not present. Hoffman's Sign - Bilateral - Hoffman's sign not present. Testing Seated Straight Leg Raise - Bilateral - Seated straight leg raise negative.  Musculoskeletal Spine/Ribs/Pelvis  Lumbosacral Spine: Inspection and Palpation - Tenderness - left lumbar paraspinals tender to palpation, right lumbar paraspinals tender to palpation and left buttock is tender to palpation. Strength and Tone: Strength - Hip Flexion - Bilateral - 5/5. Knee Extension - Bilateral - 5/5. Knee  Flexion - Bilateral - 5/5. Ankle Dorsiflexion - Bilateral - 5/5. Ankle Plantarflexion -  Bilateral - 5/5. Heel walk - Bilateral - able to heel walk with moderate difficulty. Toe Walk - Bilateral - able to walk on toes with moderate difficulty. Heel-Toe Walk - Bilateral - able to heel-toe walk without difficulty. ROM - Flexion - moderately decreased range of motion and painful. Extension - moderately decreased range of motion and painful. Left Lateral Bending - moderately decreased range of motion and painful. Right Lateral Bending - moderately decreased range of motion and painful. Right Rotation - moderately decreased range of motion and painful. Left Rotation - moderately decreased range of motion and painful. Pain - neither flexion or extension is more painful than the other. Lumbosacral Spine - Waddell's Signs - no Waddell's signs present. Lower Extremity Range of Motion - No true hip, knee or ankle pain with range of motion. Gait and Station - [employer] - no assistive devices.  MRI. Demonstrates large L2-3 left-sided disc herniation causing significant left lateral recess stenosis. Grade 1 borderline grade 2 spondylolisthesis L4-5 remains significant foraminal lateral recess stenosis left side greater than the right.  Plan at this point patient has significant back buttock and neuropathic left leg pain. Etiology appears to be the L2-3 disc herniation as well as the L4-5 spondylolisthesis. Attempts at conservative management failed to alleviate her as a result we've elected to move forward with surgery. All appropriate risks benefits and alternatives were explained to the patient and consent was obtained.  Preoperative clearance was obtained from her primary care physician.  Risks. Infection, bleeding, death, stroke, paralysis, ongoing or worse pain, hardware failure, leak of spinal fluid, need for additional surgery, blood clots, degeneration of adjacent levels which could require  surgery.  Patient is expressed an understanding of the risks and benefits of surgery, and we will proceed with a L2-3 decompression, and a transforaminal lumbar interbody fusion L4-5

## 2016-10-31 NOTE — Anesthesia Preprocedure Evaluation (Addendum)
Anesthesia Evaluation  Patient identified by MRN, date of birth, ID band Patient awake    Reviewed: Allergy & Precautions, NPO status , Patient's Chart, lab work & pertinent test results  Airway Mallampati: II   Neck ROM: Full    Dental no notable dental hx.    Pulmonary neg pulmonary ROS,    breath sounds clear to auscultation       Cardiovascular hypertension,  Rhythm:Regular Rate:Normal     Neuro/Psych    GI/Hepatic Neg liver ROS, hiatal hernia,   Endo/Other  negative endocrine ROS  Renal/GU negative Renal ROS     Musculoskeletal  (+) Arthritis ,   Abdominal   Peds  Hematology negative hematology ROS (+)   Anesthesia Other Findings   Reproductive/Obstetrics                            Anesthesia Physical Anesthesia Plan  ASA: II  Anesthesia Plan: General   Post-op Pain Management:    Induction: Intravenous  PONV Risk Score and Plan: 4 or greater and Ondansetron, Dexamethasone, Propofol, Midazolam, Scopolamine patch - Pre-op and Treatment may vary due to age or medical condition  Airway Management Planned:   Additional Equipment:   Intra-op Plan:   Post-operative Plan: Extubation in OR  Informed Consent: I have reviewed the patients History and Physical, chart, labs and discussed the procedure including the risks, benefits and alternatives for the proposed anesthesia with the patient or authorized representative who has indicated his/her understanding and acceptance.     Plan Discussed with: CRNA  Anesthesia Plan Comments:         Anesthesia Quick Evaluation

## 2016-10-31 NOTE — Op Note (Signed)
Operative dictation.  Preoperative diagnosis 1. 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  2 spondylolisthesis L4-5 with left lateral recess stenosis and left neuropathic leg pain.  Postoperative diagnosis same.  Operative procedure. L4-5 transforaminal lumbar interbody fusion. L2-3 lumbar decompression discectomy.  . Invasive MIS pedicle screw system. 6.5 diameter 40 mm length screws L5, 6.5 diameter 45 mm length screws at L4. Titan nano lock vertebral TLIF cage. A size 11 extra long. Utilized autograft, as well as DBX, and conformer allograft sheet.  Intraoperative findings. Significant L2-3 disc herniation starting at the level of the disc space and proceeding inferiorly. Caused marked compression of the L3 traversing nerve root. Multiple large fragments of disc material was removed and at the conclusion the L3 nerve root was no longer under tension.  No complicating features at the conclusion of the case. No evidence of CSF leak either at the L2-3  decompression level, or the 4/5 fusion level.  First assistant Porfirio Mylar female.  Indications this is a very pleasant 71 year old and is been having significant back buttock and radicular leg pain. Despite conservative care consisting of physical therapy, injection therapy, medications, and activity modification she continued to have severe debilitating pain. As a result of the failure we elected to proceed with surgery. A preoperative MRI demonstrated the L4-5 spondylolisthesis and lateral recess compression. However it also identified in acute L2-3 left-sided disc herniation. As a result we elected to move forward with addressing the disc herniation, as well as the instability. All appropriate risks benefits and alternatives were discussed with the patient and consent was obtained.  Operative note. Patient was brought to the operating room placed supine the operating table. After successful induction of general anesthesia and  endotracheal intubation teds SCDs and a Foley were placed. Neuro monitoring leads were then attached and then the patient was turned prone onto the Wilson frame. All bony prominences were well-padded and the back was prepped and draped in a standard fashion. Timeout was taken to confirm patient procedure and all other pertinent important data. Once this was complete fluoroscopy was brought was brought sterilely into the field to identify the L4 and L5 pedicles. Once the right L4 and L5 pedicles were identified small longitudinal incisions were made on the lateral side that of the pedicle and the Jamshidi needle was advanced parked easily down to the level of the lateral aspect of the facet. The Jamshidi was then connected to the neuro monitoring device and using live fluoroscopy as well as neuro monitoring I advanced the Jamshidi needle into the L4 pedicle and into the L4 vertebral body. I used both the AP and lateral planes to confirm position and trajectory of the Jamshidi, as well as the neuro monitoring to ensure there was no breech of the pedicle. I then placed the guidepin through the Jamshidi needle and cannulated this pedicle. Using the exact same technique I cannulated the L5 pedicle. Once both pedicles were cannulated I then tapped and then placed the appropriate size pedicle screw at each level. At L4 I placed a 45 mm length screw, and L5 I placed a 40 mm length screw. I then directly stimulated both screws and there was no abnormal EMG activity at 40 mA of stimulation.  With the right pedicle screw fixation intact I then went to the contralateral side. Because she was having more significant left leg pain this was decided elected to do the 2 Lupron. I again identified the lateral border of the L4 and L5  pedicle infiltrated this area with quarter percent Marcaine and then maintain incision I performed a standard Wiltse approach to the lumbar spine. Once I identified the 45 facet complex and the 51 facet  complex and the L4 and the L5 transverse processes I then used the same technique and advanced my Jamshidi needle into the pedicle and into the vertebral body that I just on the contralateral side I then placed my pedicle screws and directly stimulated. Once again there was no adverse EMG activity at 40 mA of stimulation.  With both pedicle screws in place I then assembled the retracting device and visualized the posterior lateral aspect of spine.  Using an osteotome I removed the entire inferior L4 facet complex. I then used a 3 mm Kerrison rongeur to perform a generous laminotomy of L4.  Using a [place] 4 I dissected through the ligamentum flavum and used my 2 and 3 mm Kerrison punch to resect the ligamentum flavum and expose the thecal sac. I continued my decompression into the lateral recess. I was able to identify the medial border of the L5 pedicle and of L2 and the L5 nerve root. I continued my decompression inferiorly and performed a foraminotomy of L5. At this point I had adequately decompressed the posterior lateral aspect of the thecal sac and the L5 nerve root I then went superiorly resecting the entire L4 pars and unroofing and identifying the L4 nerve root and continued my bony dissected decompression up until I could palpate the inferior aspect of the L4 pedicle at this point had adequately decompressed the L4 and L5 nerve root in the posterior lateral aspect of the spine. I completely expose the posterior lateral aspect of the L4-5 disc space.  Neuro patties were placed to protect the L4-L5 nerve root S retractor was placed to protect thecal sac medially and annulotomy was then performed with a 15 blade scalpel and then I used accommodation pituitary rongeurs curettes and Kerrison rongeurs to effect a complete discectomy of the L4-5 level. I could visualize the bony endplates and I made sure they were bleeding. At this point with the discectomy complete I then trialed and elected to proceed  with the size 11 extra long cage. The conformer sheet which had been soaking in the the patient's blood was then placed along the anterior annulus as was some of the autograft bone. It was then compressed and then the cage was inserted. The cage initially when and vertically and then I was able to push across horizontally into the anterior third of the disc space it was well-positioned and well centered.  At this point I then connected the polyaxial heads and then assembled pedicle screw rod construct. The rod was then secured to the pedicle screws screws using the top length and it was torqued according manufacturer's standards.  With the with the fusion and decompression completed L4-5  I turned my attention to the 2/3 level. Fluoroscopy was again used to confirm my level and a midline incision was made and sharp dissection was carried out down to the deep fascia the deep fascia was sharply incised and I stripped the paraspinal muscles to expose the L2-L3 spinous process and lamina. Self-retaining retractor was placed and a Penfield 4 was placed underneath the L2 lamina. Fluoroscopy was then used in the lateral film to identify the [place] 4 and confirm that I was at the L2 level. Once the appropriate level was confirmed I used my 3 mm Kerrison rongeur to perform a laminotomy  of L3. I also took down the spinous process. I had a central decompression as well as a left lateral recess decompression complete. I dissected through the ligamentum flavum and removed this. This point I can a clear visualization down into the left lateral recess with the disc herniation was located. Identify the L3 nerve root and mobilized it and obtained hemostasis using bipolar cautery. The disc space was clearly visualized and annulotomy was performed a 15 blade scalpel I then used my nerve hook to deliver 3 large fragments of disc material through the annulotomy created this was removed with the pituitary rongeurs. I then continued  sweeping underneath the L3 nerve root to deliver the disc fragments that I had seen on the preoperative MRI. Once this was done I then swept circumferentially at the disc space level again removing all of the disc fragment. I then went superiorly. At this point I was very pleased with the discectomy. I removed multiple large fragments of disc material and was consistent with what was seen on the preoperative MRI. In addition the thecal sac had expanded into the entire surgical field and the L3 nerve root was free of any neural tension.  At this point with the component decompression complete I irrigated all wounds copiously with normal saline. I made sure hemostasis using bipolar electrocautery as well as FloSeal. Thrombin-soaked Gelfoam padding was placed over the L2-3 laminotomy site. I then closed both wounds with interrupted #1 Vicryl sutures and then the remaining and then the wounds were closed with 2-0 Vicryl suture and 3-0 Monocryl. Steri-Strips and dry dressings were applied. The patient was ultimately extubated transferred the PACU that incident. The end of the case all needle sponge counts were correct. There were no adverse intraoperative events. There were no adverse monitoring events either.

## 2016-11-01 ENCOUNTER — Encounter (HOSPITAL_COMMUNITY): Payer: Self-pay | Admitting: Orthopedic Surgery

## 2016-11-01 ENCOUNTER — Inpatient Hospital Stay (HOSPITAL_COMMUNITY): Payer: Medicare HMO

## 2016-11-01 MED ORDER — OXYCODONE HCL 10 MG PO TABS: 10.0000 mg | ORAL_TABLET | ORAL | 0 refills | Status: AC | PRN

## 2016-11-01 MED ORDER — ALUM & MAG HYDROXIDE-SIMETH 200-200-20 MG/5ML PO SUSP
30.0000 mL | ORAL | Status: DC | PRN
  Administered 2016-11-01: 30 mL via ORAL
  Filled 2016-11-01: qty 30

## 2016-11-01 MED ORDER — METHOCARBAMOL 500 MG PO TABS: 500.0000 mg | ORAL_TABLET | Freq: Three times a day (TID) | ORAL | 0 refills | Status: AC

## 2016-11-01 MED ORDER — ONDANSETRON 4 MG PO TBDP: 4.0000 mg | ORAL_TABLET | Freq: Three times a day (TID) | ORAL | 0 refills | Status: AC | PRN

## 2016-11-01 NOTE — Progress Notes (Addendum)
    Subjective: 1 Day Post-Op Procedure(s) (LRB): TRANSFORAMINAL LUMBAR INTERBODY FUSION (TLIF) L4-5 (N/A) LUMBAR L2-3 LAMINECTOMY/DECOMPRESSION MICRODISCECTOMY 1 LEVEL (N/A) Patient reports pain as 3 on 0-10 scale.   Denies CP or SOB.  Voiding without difficulty. Positive flatus. Objective: Vital signs in last 24 hours: Temp:  [98 F (36.7 C)-98.9 F (37.2 C)] 98.2 F (36.8 C) (06/28 0400) Pulse Rate:  [65-93] 79 (06/28 0400) Resp:  [10-20] 18 (06/28 0400) BP: (103-146)/(51-87) 126/65 (06/28 0400) SpO2:  [91 %-100 %] 94 % (06/28 0400)  Intake/Output from previous day: 06/27 0701 - 06/28 0700 In: 3200 [I.V.:2700; IV Piggyback:500] Out: 2600 [Urine:2450; Blood:150] Intake/Output this shift: No intake/output data recorded.  Labs: No results for input(s): HGB in the last 72 hours. No results for input(s): WBC, RBC, HCT, PLT in the last 72 hours. No results for input(s): NA, K, CL, CO2, BUN, CREATININE, GLUCOSE, CALCIUM in the last 72 hours. No results for input(s): LABPT, INR in the last 72 hours.  Physical Exam: Neurologically intact ABD soft Sensation intact distally Dorsiflexion/Plantar flexion intact Incision: scant drainage Compartment soft  Assessment/Plan: 1 Day Post-Op Procedure(s) (LRB): TRANSFORAMINAL LUMBAR INTERBODY FUSION (TLIF) L4-5 (N/A) LUMBAR L2-3 LAMINECTOMY/DECOMPRESSION MICRODISCECTOMY 1 LEVEL (N/A) Advance diet Up with therapy  Considering DC today after cleared by PT   [facility], Baxter Kailarmen Christina for Dr. Venita Lickahari Rishi Vicario [facility] [phone] 11/01/2016, 7:04 AM    Patient ID: Sierra Jones, female   DOB: Mar 21, 1946, 71 y.o.   MRN: [id number]   Patient doing well overall Radicular leg pain improved xrays pending Plan on d/c today if cleared by PT and xrays satisfactory.

## 2016-11-01 NOTE — Evaluation (Signed)
Occupational Therapy Evaluation and Discharge Patient Details Name: Sierra Jones MRN: [id number] DOB: [birth year] Today's Date: 11/01/2016    History of Present Illness L4-5 transforaminal lumbar interbody fusion. L2-3 lumbar decompression discectomy   Clinical Impression   This 71 yo female admitted and underwent above presents to acute OT with all OT education completed, we will D/C from acute OT.   Follow Up Recommendations  No OT follow up;Supervision - Intermittent    Equipment Recommendations  3 in 1 bedside commode;Other (comment) (RW)       Precautions / Restrictions Precautions Precautions: Back Precaution Booklet Issued: Yes (comment) Required Braces or Orthoses: Spinal Brace Spinal Brace: Lumbar corset;Applied in sitting position      Mobility Bed Mobility               General bed mobility comments: Pt already sitting EOB upon my arrival (however pt reports she did get up to EOB as the handout demonstrates  Transfers Overall transfer level: Needs assistance Equipment used: Rolling walker (2 wheeled) Transfers: Sit to/from Stand Sit to Stand: Supervision              Balance Overall balance assessment: Needs assistance Sitting-balance support: No upper extremity supported;Feet supported Sitting balance-Leahy Scale: Good     Standing balance support: Bilateral upper extremity supported Standing balance-Leahy Scale: Poor Standing balance comment: RW use                           ADL either performed or assessed with clinical judgement   ADL Overall ADL's : Needs assistance/impaired Eating/Feeding: Independent;Sitting   Grooming: Supervision/safety;Standing   Upper Body Bathing: Supervision/ safety;Sitting   Lower Body Bathing: Supervison/ safety;Sit to/from stand   Upper Body Dressing : Supervision/safety;Sitting   Lower Body Dressing: Supervision/safety;Sit to/from stand   Toilet Transfer:  Supervision/safety;Ambulation;Regular Toilet;Grab bars;RW   Toileting- Architect and Hygiene: Supervision/safety;Sit to/from stand   Tub/ Shower Transfer: Supervision/safety;Ambulation;Shower Dealer Details (indicate cue type and reason): side step Functional mobility during ADLs: Supervision/safety General ADL Comments: Educated pt on use of two cups for brushing teeth to avoid bending over the sink; using wet wipes for back peri-care (to avoid twisitng as much), sequencing of dressing and positioning for dressing     Vision Patient Visual Report: No change from baseline              Pertinent Vitals/Pain Pain Assessment: 0-10 Pain Score: 3  Pain Location: back Pain Descriptors / Indicators: Cramping Pain Intervention(s): Limited activity within patient's tolerance;Repositioned     Hand Dominance Right   Extremity/Trunk Assessment Upper Extremity Assessment Upper Extremity Assessment: Overall WFL for tasks assessed   Lower Extremity Assessment Lower Extremity Assessment: Defer to PT evaluation       Communication Communication Communication: No difficulties   Cognition Arousal/Alertness: Awake/alert Behavior During Therapy: WFL for tasks assessed/performed Overall Cognitive Status: Within Functional Limits for tasks assessed                                                Home Living Family/patient expects to be discharged to:: Private residence Living Arrangements: Children Available Help at Discharge: Family;Available PRN/intermittently (son works during the day) Type of Home: House Home Access: Stairs to enter [employer] of Steps: 3 Entrance Stairs-Rails: None Home Layout: One level  Bathroom Shower/Tub: IT trainerTub/shower unit;Curtain   Bathroom Toilet: Standard     Home Equipment: Shower seat;Hand held shower head;Adaptive equipment Adaptive Equipment: Reacher        Prior  Functioning/Environment Level of Independence: Independent                 OT Problem List: Decreased strength;Decreased range of motion;Pain         OT Goals(Current goals can be found in the care plan section) Acute Rehab OT Goals Patient Stated Goal: home probably today  OT Frequency:                AM-PAC PT "6 Clicks" Daily Activity     Outcome Measure Help from another person eating meals?: None Help from another person taking care of personal grooming?: None Help from another person toileting, which includes using toliet, bedpan, or urinal?: None Help from another person bathing (including washing, rinsing, drying)?: None Help from another person to put on and taking off regular upper body clothing?: None Help from another person to put on and taking off regular lower body clothing?: None 6 Click Score: 24   End of Session Equipment Utilized During Treatment: Gait belt;Rolling walker;Back brace Nurse Communication:  (pt needs a 3n1 and RW)  Activity Tolerance: Patient tolerated treatment well Patient left: in chair;with call bell/phone within reach  OT Visit Diagnosis: Unsteadiness on feet (R26.81);Pain Pain - part of body:  (back)                Time: 0713-0800 OT Time Calculation (min): 47 min Charges:  OT General Charges $OT Visit: 1 Procedure OT Evaluation $OT Eval Moderate Complexity: 1 Procedure OT Treatments $Self Care/Home Management : 23-37 mins Ignacia PalmaCathy Krishana Lutze, OTR/L [phone] 11/01/2016

## 2016-11-01 NOTE — Evaluation (Signed)
Physical Therapy Evaluation Patient Details Name: Sierra Jones MRN: [id number] DOB: Aug 31, 1945 Today's Date: 11/01/2016   History of Present Illness  L4-5 transforaminal lumbar interbody fusion. L2-3 lumbar decompression discectomy  Clinical Impression  Pt admitted with above diagnosis. Pt currently with functional limitations due to the deficits listed below (see PT Problem List). At the time of PT eval pt was able to perform transfers and ambulation with gross min guard assist for balance support and safety. Tolerance for functional activity is low and pt fatigues fairly quickly with OOB mobility. Was not able to complete stair training this session due to fatigue, and pt requesting another day before d/c. Feel this is reasonable given current endurance level, and pt will require stair training with PT prior to d/c.  Pt will benefit from skilled PT to increase their independence and safety with mobility to allow discharge to the venue listed below.       Follow Up Recommendations No PT follow up;Supervision for mobility/OOB    Equipment Recommendations  Rolling walker with 5" wheels;3in1 (PT)    Recommendations for Other Services       Precautions / Restrictions Precautions Precautions: Back Precaution Booklet Issued: Yes (comment) Precaution Comments: Pt was cued for precautions during functional mobility.  Required Braces or Orthoses: Spinal Brace Spinal Brace: Lumbar corset;Applied in sitting position Restrictions Weight Bearing Restrictions: No      Mobility  Bed Mobility Overal bed mobility: Needs Assistance Bed Mobility: Sit to Sidelying;Rolling Rolling: Supervision       Sit to sidelying: Supervision General bed mobility comments: VC's for sequencing and general safety awareness with logroll/maintenance of precautions.   Transfers Overall transfer level: Needs assistance Equipment used: Rolling walker (2 wheeled) Transfers: Sit to/from Stand Sit to Stand:  Supervision            Ambulation/Gait Ambulation/Gait assistance: Min guard Ambulation Distance (Feet): 225 Feet Assistive device: Rolling walker (2 wheeled) Gait Pattern/deviations: Step-through pattern;Decreased stride length;Trunk flexed;Narrow base of support Gait velocity: Decreased Gait velocity interpretation: Below normal speed for age/gender General Gait Details: VC's for improved posture, walker placement, and general safety. Pt fatigued and had somewhat poor insight to decreased tolerance for activity. Standing rest breaks required on the way back to her room, and pt appeared dyspneic upon return to room.   Stairs            Wheelchair Mobility    Modified Rankin (Stroke Patients Only)       Balance Overall balance assessment: Needs assistance Sitting-balance support: No upper extremity supported;Feet supported Sitting balance-Leahy Scale: Good     Standing balance support: Bilateral upper extremity supported Standing balance-Leahy Scale: Poor Standing balance comment: RW use                             Pertinent Vitals/Pain Pain Assessment: Faces Pain Score: 3  Faces Pain Scale: Hurts little more Pain Location: back Pain Descriptors / Indicators: Operative site guarding;Discomfort Pain Intervention(s): Premedicated before session    Home Living Family/patient expects to be discharged to:: Private residence Living Arrangements: Children Available Help at Discharge: Family;Available PRN/intermittently (son works during the day) Type of Home: House Home Access: Stairs to enter Entrance Stairs-Rails: None Secretary/administratorntrance Stairs-Number of Steps: 3 Home Layout: One level Home Equipment: Shower seat;Hand held shower head;Adaptive equipment      Prior Function Level of Independence: Independent  Hand Dominance   Dominant Hand: Right    Extremity/Trunk Assessment   Upper Extremity Assessment Upper Extremity Assessment:  Defer to OT evaluation    Lower Extremity Assessment Lower Extremity Assessment: Generalized weakness (consistent with pre-op diagnosis)    Cervical / Trunk Assessment Cervical / Trunk Assessment: Other exceptions Cervical / Trunk Exceptions: Forward head/rounded shoulder posture.   Communication   Communication: No difficulties  Cognition Arousal/Alertness: Awake/alert Behavior During Therapy: WFL for tasks assessed/performed Overall Cognitive Status: Within Functional Limits for tasks assessed                                        General Comments      Exercises     Assessment/Plan    PT Assessment Patient needs continued PT services  PT Problem List Decreased strength;Decreased range of motion;Decreased activity tolerance;Decreased balance;Decreased mobility;Decreased knowledge of use of DME;Decreased safety awareness;Decreased knowledge of precautions;Pain       PT Treatment Interventions DME instruction;Gait training;Stair training;Functional mobility training;Therapeutic activities;Therapeutic exercise;Neuromuscular re-education;Patient/family education    PT Goals (Current goals can be found in the Care Plan section)  Acute Rehab PT Goals Patient Stated Goal: home tomorrow PT Goal Formulation: With patient Time For Goal Achievement: 11/08/16 Potential to Achieve Goals: Good    Frequency Min 5X/week   Barriers to discharge Decreased caregiver support Son present short term 24 hours however after that she will be alone while he is at work    Co-evaluation               AM-PAC PT "6 Clicks" Daily Activity  Outcome Measure Difficulty turning over in bed (including adjusting bedclothes, sheets and blankets)?: None Difficulty moving from lying on back to sitting on the side of the bed? : None Difficulty sitting down on and standing up from a chair with arms (e.g., wheelchair, bedside commode, etc,.)?: A Little Help needed moving to and from  a bed to chair (including a wheelchair)?: A Little Help needed walking in hospital room?: A Little Help needed climbing 3-5 steps with a railing? : A Little 6 Click Score: 20    End of Session Equipment Utilized During Treatment: Back brace Activity Tolerance: Patient limited by fatigue Patient left: in bed;with call bell/phone within reach Nurse Communication: Mobility status PT Visit Diagnosis: Unsteadiness on feet (R26.81);Pain Pain - part of body:  (back)    Time: 8119-1478 PT Time Calculation (min) (ACUTE ONLY): 24 min   Charges:   PT Evaluation $PT Eval Moderate Complexity: 1 Procedure PT Treatments $Gait Training: 8-22 mins   PT G Codes:        Conni Slipper, PT, DPT Acute Rehabilitation Services Pager: [phone]   Marylynn Pearson 11/01/2016, 11:20 AM

## 2016-11-02 NOTE — Progress Notes (Signed)
    Subjective: Procedure(s) (LRB): TRANSFORAMINAL LUMBAR INTERBODY FUSION (TLIF) L4-5 (N/A) LUMBAR L2-3 LAMINECTOMY/DECOMPRESSION MICRODISCECTOMY 1 LEVEL (N/A) 2 Days Post-Op  Patient reports pain as 2 on 0-10 scale.  Reports decreased leg pain reports incisional back pain   Positive void Negative bowel movement Positive flatus Negative chest pain or shortness of breath  Objective: Vital signs in last 24 hours: Temp:  [98.2 F (36.8 C)-98.8 F (37.1 C)] 98.5 F (36.9 C) (06/29 0400) Pulse Rate:  [71-78] 73 (06/29 0400) Resp:  [18] 18 (06/29 0400) BP: (125-147)/(63-93) 146/74 (06/29 0400) SpO2:  [96 %-100 %] 96 % (06/29 0400)  Intake/Output from previous day: 06/28 0701 - 06/29 0700 In: 360 [P.O.:360] Out: -   Labs: No results for input(s): WBC, RBC, HCT, PLT in the last 72 hours. No results for input(s): NA, K, CL, CO2, BUN, CREATININE, GLUCOSE, CALCIUM in the last 72 hours. No results for input(s): LABPT, INR in the last 72 hours.  Physical Exam: Neurologically intact ABD soft Intact pulses distally Incision: dressing C/D/I Compartment soft  Assessment/Plan: Patient stable  xrays satisfactory Continue mobilization with physical therapy Continue care  Advance diet Up with therapy  Patient doing well Plan on d/c to home today  Venita Lickahari Maninder Deboer, MD [facility] [phone]

## 2016-11-02 NOTE — Progress Notes (Signed)
Physical Therapy Treatment Patient Details Name: Sierra Jones MRN: [id number] DOB: [birth year] Today's Date: 11/02/2016    History of Present Illness L4-5 transforaminal lumbar interbody fusion. L2-3 lumbar decompression discectomy    PT Comments    Pt progressing towards physical therapy goals. Was able to perform transfers and ambulation with min guard to supervision for safety and RW use. Pt anticipates d/c home today. She was educated on stair negotiation, walking program, car transfer, brace application/wearing schedule, and general precautions. Will continue to follow and progress as able per POC.    Follow Up Recommendations  No PT follow up;Supervision for mobility/OOB     Equipment Recommendations  Rolling walker with 5" wheels;3in1 (PT)    Recommendations for Other Services       Precautions / Restrictions Precautions Precautions: Back Precaution Booklet Issued: Yes (comment) Precaution Comments: Pt was cued for precautions during functional mobility.  Required Braces or Orthoses: Spinal Brace Spinal Brace: Lumbar corset;Applied in sitting position Restrictions Weight Bearing Restrictions: No    Mobility  Bed Mobility               General bed mobility comments: Pt was received sitting up in recliner.   Transfers Overall transfer level: Needs assistance Equipment used: Rolling walker (2 wheeled) Transfers: Sit to/from Stand Sit to Stand: Supervision         General transfer comment: light suprvision for safety. No assist required.   Ambulation/Gait Ambulation/Gait assistance: Min guard;Supervision Ambulation Distance (Feet): 350 Feet Assistive device: Rolling walker (2 wheeled) Gait Pattern/deviations: Step-through pattern;Decreased stride length;Trunk flexed;Narrow base of support Gait velocity: Decreased Gait velocity interpretation: Below normal speed for age/gender General Gait Details: VC's for improved posture, walker placement, and  general safety. Pt initially required min guard assist and progressed to supervision for safety. Close guard again provided at end of gait training due to fatigue.    Stairs Stairs: Yes   Stair Management: No rails;Step to pattern;Backwards;With walker Number of Stairs: 4 (1 stair x4 trials. ) General stair comments: VC's for sequencing and safety. Pt was provided handout for instructions to negotiate stairs backwards with walker. Pt demonstrated well.   Wheelchair Mobility    Modified Rankin (Stroke Patients Only)       Balance Overall balance assessment: Needs assistance Sitting-balance support: No upper extremity supported;Feet supported Sitting balance-Leahy Scale: Good     Standing balance support: Bilateral upper extremity supported Standing balance-Leahy Scale: Poor Standing balance comment: RW use                            Cognition Arousal/Alertness: Awake/alert Behavior During Therapy: WFL for tasks assessed/performed Overall Cognitive Status: Within Functional Limits for tasks assessed                                        Exercises      General Comments        Pertinent Vitals/Pain Pain Assessment: Faces Faces Pain Scale: Hurts little more Pain Location: back Pain Descriptors / Indicators: Operative site guarding;Discomfort Pain Intervention(s): Limited activity within patient's tolerance;Monitored during session;Repositioned    Home Living                      Prior Function            PT Goals (current goals can now be found in  the care plan section) Acute Rehab PT Goals Patient Stated Goal: home today PT Goal Formulation: With patient Time For Goal Achievement: 11/08/16 Potential to Achieve Goals: Good Progress towards PT goals: Progressing toward goals    Frequency    Min 5X/week      PT Plan Current plan remains appropriate    Co-evaluation              AM-PAC PT "6 Clicks" Daily  Activity  Outcome Measure  Difficulty turning over in bed (including adjusting bedclothes, sheets and blankets)?: None Difficulty moving from lying on back to sitting on the side of the bed? : None Difficulty sitting down on and standing up from a chair with arms (e.g., wheelchair, bedside commode, etc,.)?: A Little Help needed moving to and from a bed to chair (including a wheelchair)?: A Little Help needed walking in hospital room?: A Little Help needed climbing 3-5 steps with a railing? : A Little 6 Click Score: 20    End of Session Equipment Utilized During Treatment: Back brace Activity Tolerance: Patient limited by fatigue Patient left: in bed;with call bell/phone within reach Nurse Communication: Mobility status PT Visit Diagnosis: Unsteadiness on feet (R26.81);Pain Pain - part of body:  (back)     Time: [id number] PT Time Calculation (min) (ACUTE ONLY): 29 min  Charges:  $Gait Training: 23-37 mins                    G Codes:       Conni SlipperLaura Reaghan Kawa, PT, DPT Acute Rehabilitation Services Pager: [phone]    Marylynn PearsonLaura D Huber Mathers 11/02/2016, 10:37 AM

## 2016-11-02 NOTE — Progress Notes (Signed)
Patient alert and oriented, mae's well, voiding adequate amount of urine, swallowing without difficulty, no c/o pain at time of discharge. Patient discharged home with family. Script and discharged instructions given to patient. Patient and family stated understanding of instructions given. Patient has an appointment with Dr. Brooks  

## 2016-11-15 NOTE — Discharge Summary (Signed)
Patient ID: Sierra Garnetellie Wandell MRN: [id number] DOB/AGE: Dec 11, 1945 71 y.o.  Admit date: 10/31/2016 Discharge date: 11/02/16  Admission Diagnoses:  Active Problems:   Back pain   Status post lumbar surgery   Discharge Diagnoses:  Active Problems:   Back pain   Status post lumbar surgery  status post Procedure(s): TRANSFORAMINAL LUMBAR INTERBODY FUSION (TLIF) L4-5 LUMBAR L2-3 LAMINECTOMY/DECOMPRESSION MICRODISCECTOMY 1 LEVEL  Past Medical History:  Diagnosis Date  . Arthritis   . Depression   . History of hiatal hernia   . Hypertension     Surgeries: Procedure(s): TRANSFORAMINAL LUMBAR INTERBODY FUSION (TLIF) L4-5 LUMBAR L2-3 LAMINECTOMY/DECOMPRESSION MICRODISCECTOMY 1 LEVEL on 10/31/2016   Consultants:   Discharged Condition: Improved  Hospital Course: Sierra Jones is an 71 y.o. female who was admitted 10/31/2016 for operative treatment of Lumbar DDD. Patient failed conservative treatments (please see the history and physical for the specifics) and had severe unremitting pain that affects sleep, daily activities and work/hobbies. After pre-op clearance, the patient was taken to the operating room on 10/31/2016 and underwent  Procedure(s): TRANSFORAMINAL LUMBAR INTERBODY FUSION (TLIF) L4-5 LUMBAR L2-3 LAMINECTOMY/DECOMPRESSION MICRODISCECTOMY 1 LEVEL.  Post op day 2 the pt reports a low level of pain controlled on oral medication.  The pt is ambulating in hallway.  Pt is eating and urinating w/o difficulty.  Pt is cleared by PT for Dc home.   Patient was given perioperative antibiotics:  Anti-infectives    Start     Dose/Rate Route Frequency Ordered Stop   10/31/16 2000  ceFAZolin (ANCEF) IVPB 2g/100 mL premix     2 g 200 mL/hr over 30 Minutes Intravenous Every 8 hours 10/31/16 1934 11/01/16 0355   10/31/16 0550  ceFAZolin (ANCEF) IVPB 2g/100 mL premix     2 g 200 mL/hr over 30 Minutes Intravenous 30 min pre-op 10/31/16 0550 10/31/16 0840       Patient was given  sequential compression devices and early ambulation to prevent DVT.   Patient benefited maximally from hospital stay and there were no complications. At the time of discharge, the patient was urinating/moving their bowels without difficulty, tolerating a regular diet, pain is controlled with oral pain medications and they have been cleared by PT/OT.   Recent vital signs: No data found.    Recent laboratory studies: No results for input(s): WBC, HGB, HCT, PLT, NA, K, CL, CO2, BUN, CREATININE, GLUCOSE, INR, CALCIUM in the last 72 hours.  Invalid input(s): PT, 2   Discharge Medications:   Allergies as of 11/02/2016      Reactions   Codeine Rash   Statins Rash   Stiffness in joints      Medication List    TAKE these medications   acetaminophen 500 MG tablet Commonly known as:  TYLENOL Take 1,000 mg by mouth every 8 (eight) hours as needed for mild pain.   atenolol 50 MG tablet Commonly known as:  TENORMIN Take 50 mg by mouth daily.   ferrous sulfate 325 (65 FE) MG tablet Take 325 mg by mouth daily with breakfast.   FLUoxetine 20 MG tablet Commonly known as:  PROZAC Take 20 mg by mouth 2 (two) times daily.   methocarbamol 500 MG tablet Commonly known as:  ROBAXIN Take 1 tablet (500 mg total) by mouth 3 (three) times daily.   multivitamin with minerals Tabs tablet Take 1 tablet by mouth daily.   ondansetron 4 MG disintegrating tablet Commonly known as:  ZOFRAN ODT Take 1 tablet (4 mg total) by mouth  every 8 (eight) hours as needed for nausea or vomiting.   pregabalin 75 MG capsule Commonly known as:  LYRICA Take 75 mg by mouth 2 (two) times daily.   triamterene-hydrochlorothiazide 37.5-25 MG tablet Commonly known as:  MAXZIDE-25 Take 1 tablet by mouth daily.   Vitamin D 2000 units tablet Take 2,000 Units by mouth daily.     ASK your doctor about these medications   Oxycodone HCl 10 MG Tabs Take 1 tablet (10 mg total) by mouth every 4 (four) hours as needed for  severe pain. Ask about: Should I take this medication?       Diagnostic Studies: Dg Lumbar Spine 2-3 Views  Result Date: 11/01/2016 CLINICAL DATA:  The patient underwent L4-5 PLIF yesterday. The patient is complaining of pain today. EXAM: LUMBAR SPINE - 2-3 VIEW COMPARISON:  Intraoperative fluoro spot views from yesterday. FINDINGS: The fusion appliances appear to be in stable position. The fourth and fifth vertebral bodies remain normally aligned. The other lumbar vertebral bodies are normal in height and alignment. There is old mild anterior superior cortical depression of L3. IMPRESSION: No acute bony abnormality of the lumbar spine is observed. The fusion appliances appear to be in stable position. Electronically Signed   By: David  [place] M.D.   On: 11/01/2016 13:25   Dg Lumbar Spine 2-3 Views  Result Date: 10/31/2016 CLINICAL DATA:  L4-5 TLIF EXAM: DG C-ARM GT 120 MIN; LUMBAR SPINE - 2-3 VIEW COMPARISON:  None. FLUOROSCOPY TIME:  Fluoroscopy Time:  3 minutes 48 seconds. Number of Acquired Spot Images: 3 FINDINGS: Three spot fluoroscopic nondiagnostic intraoperative radiographs of the lumbar spine demonstrate postsurgical changes from bilateral posterior spinal fusion at L4-5 with bone cage in the L4-5 disc space. Posterior approach surgical marking device terminates over the posterior elements at the upper L3 level on the final view. IMPRESSION: Intraoperative fluoroscopic guidance for posterior spinal fusion at L4-5. Electronically Signed   By: Delbert Phenix M.D.   On: 10/31/2016 11:44   Dg C-arm Gt 120 Min  Result Date: 10/31/2016 CLINICAL DATA:  L4-5 TLIF EXAM: DG C-ARM GT 120 MIN; LUMBAR SPINE - 2-3 VIEW COMPARISON:  None. FLUOROSCOPY TIME:  Fluoroscopy Time:  3 minutes 48 seconds. Number of Acquired Spot Images: 3 FINDINGS: Three spot fluoroscopic nondiagnostic intraoperative radiographs of the lumbar spine demonstrate postsurgical changes from bilateral posterior spinal fusion at L4-5 with  bone cage in the L4-5 disc space. Posterior approach surgical marking device terminates over the posterior elements at the upper L3 level on the final view. IMPRESSION: Intraoperative fluoroscopic guidance for posterior spinal fusion at L4-5. Electronically Signed   By: Delbert Phenix M.D.   On: 10/31/2016 11:44    Discharge Instructions    Incentive spirometry RT    Complete by:  As directed       Follow-up Information    Venita Lick, MD Follow up.   Specialty:  Orthopedic Surgery Contact information: [address] [place] [address] [phone]           Discharge Plan:  discharge to home Pt will present to clinic in 2 weeks Post op medication provided for the pt  Disposition:     Signed: [facility], Baxter Kail for Dr. Venita Lick [facility] [phone] 11/15/2016, 3:29 PM

## 2017-12-15 IMAGING — RF DG C-ARM GT 120 MIN
1 series · 3 of 3 positions shown · non-contrast
Comparison: None.

CLINICAL DATA: L4-5 TLIF

EXAM:
DG C-ARM GT 120 MIN; LUMBAR SPINE - 2-3 VIEW

[Series 1: run · 3 of 3 slices shown]
[im 1/3]
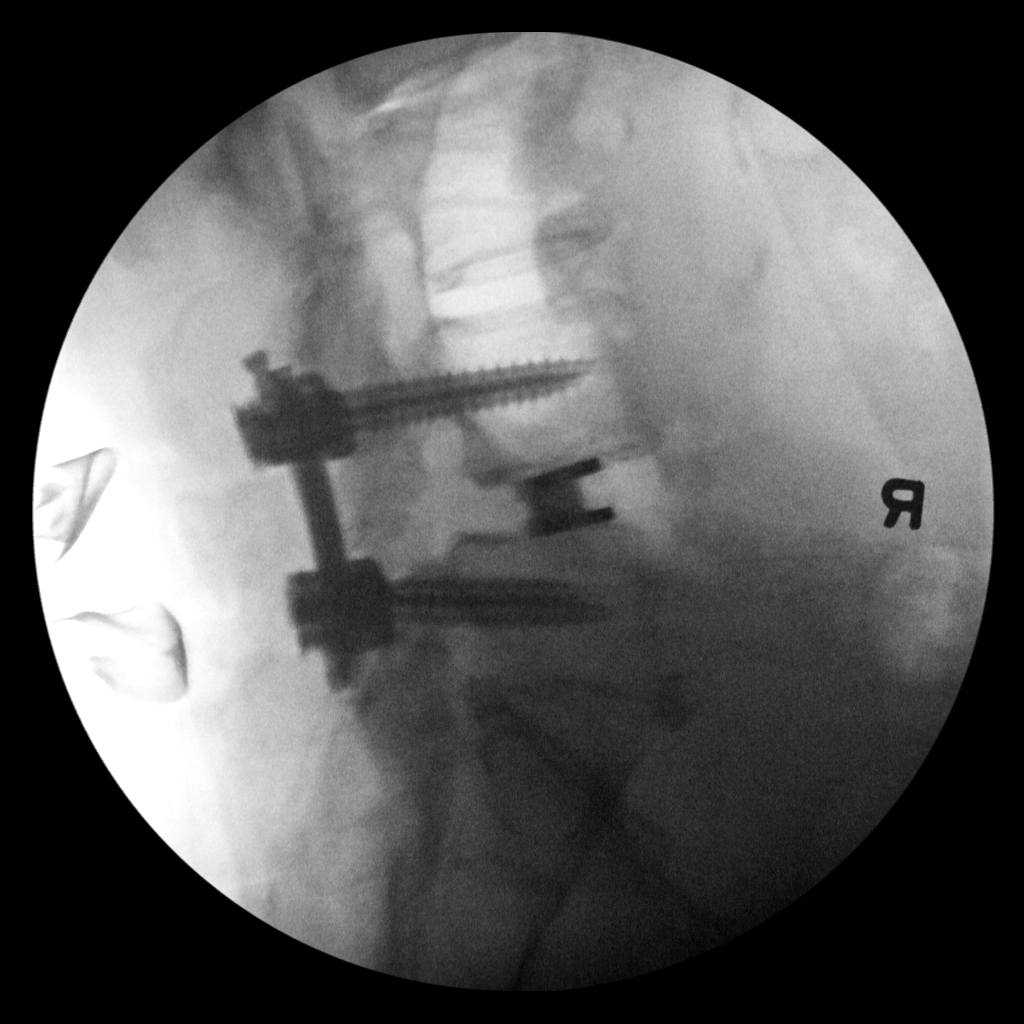
[im 2/3]
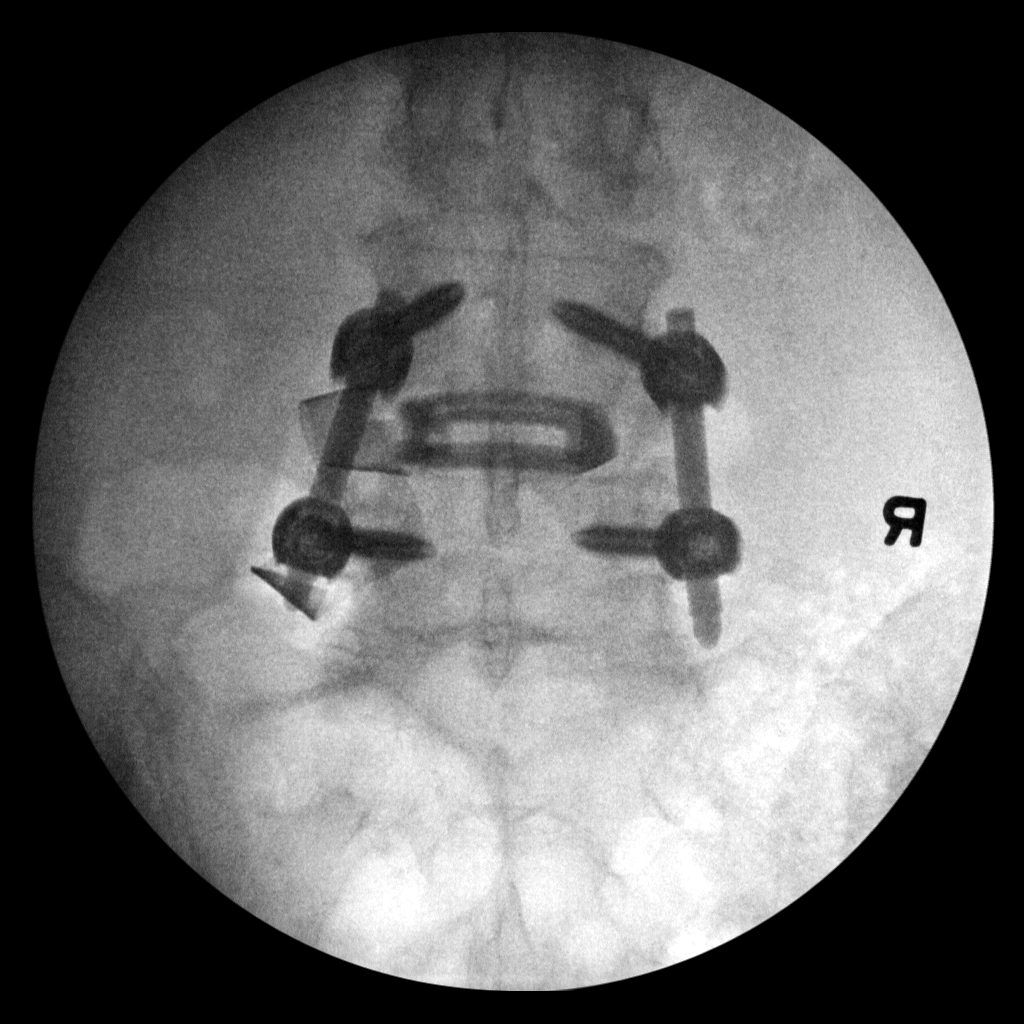
[im 3/3]
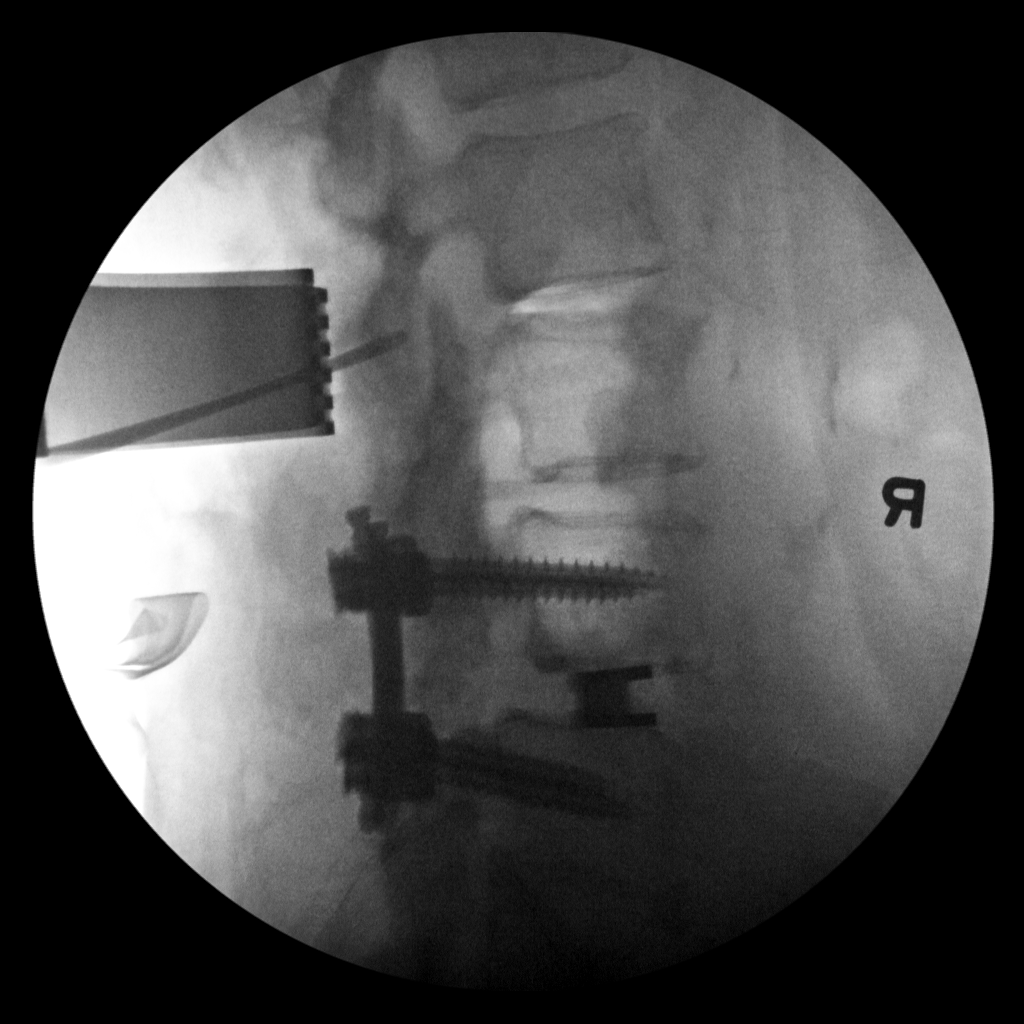

[3 of 3 positions shown; findings below may reference images not displayed]

FLUOROSCOPY TIME:  Fluoroscopy Time:  3 minutes 48 seconds.

Number of Acquired Spot Images: 3
FINDINGS: Three spot fluoroscopic nondiagnostic intraoperative radiographs of
the lumbar spine demonstrate postsurgical changes from bilateral
posterior spinal fusion at L4-5 with bone cage in the L4-5 disc
space. Posterior approach surgical marking device terminates over
the posterior elements at the upper L3 level on the final view.
IMPRESSION: Intraoperative fluoroscopic guidance for posterior spinal fusion at
L4-5.

## 2017-12-16 IMAGING — DX DG LUMBAR SPINE 2-3V
3 series · 3 of 3 positions shown · non-contrast
Comparison: Intraoperative fluoro spot views from yesterday.

CLINICAL DATA: The patient underwent L4-5 PLIF yesterday. The
patient is complaining of pain today.

EXAM:
LUMBAR SPINE - 2-3 VIEW

[t lumbar spine ap]
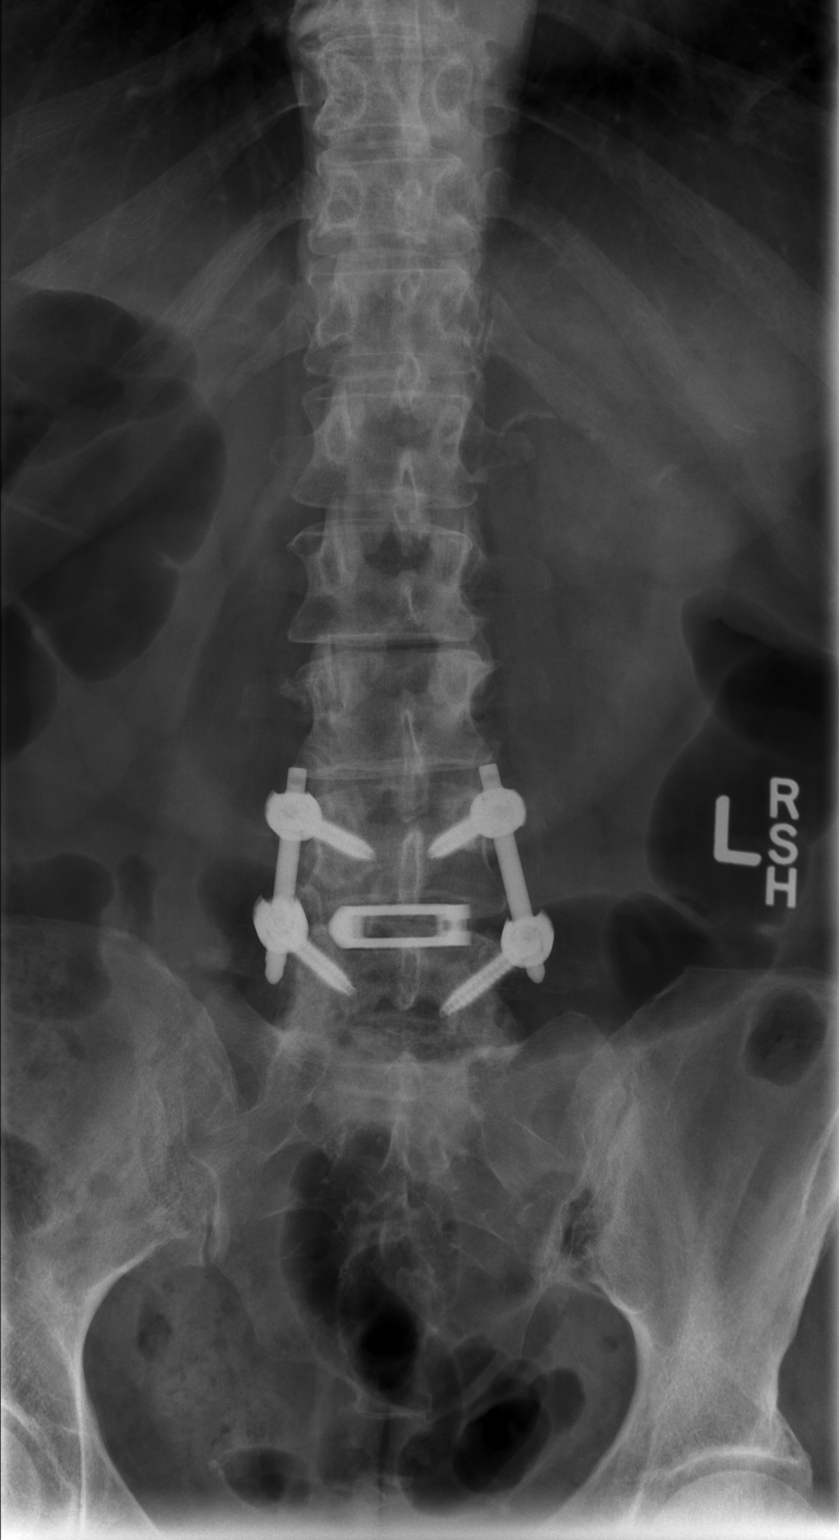

[t lumbar spine lat]
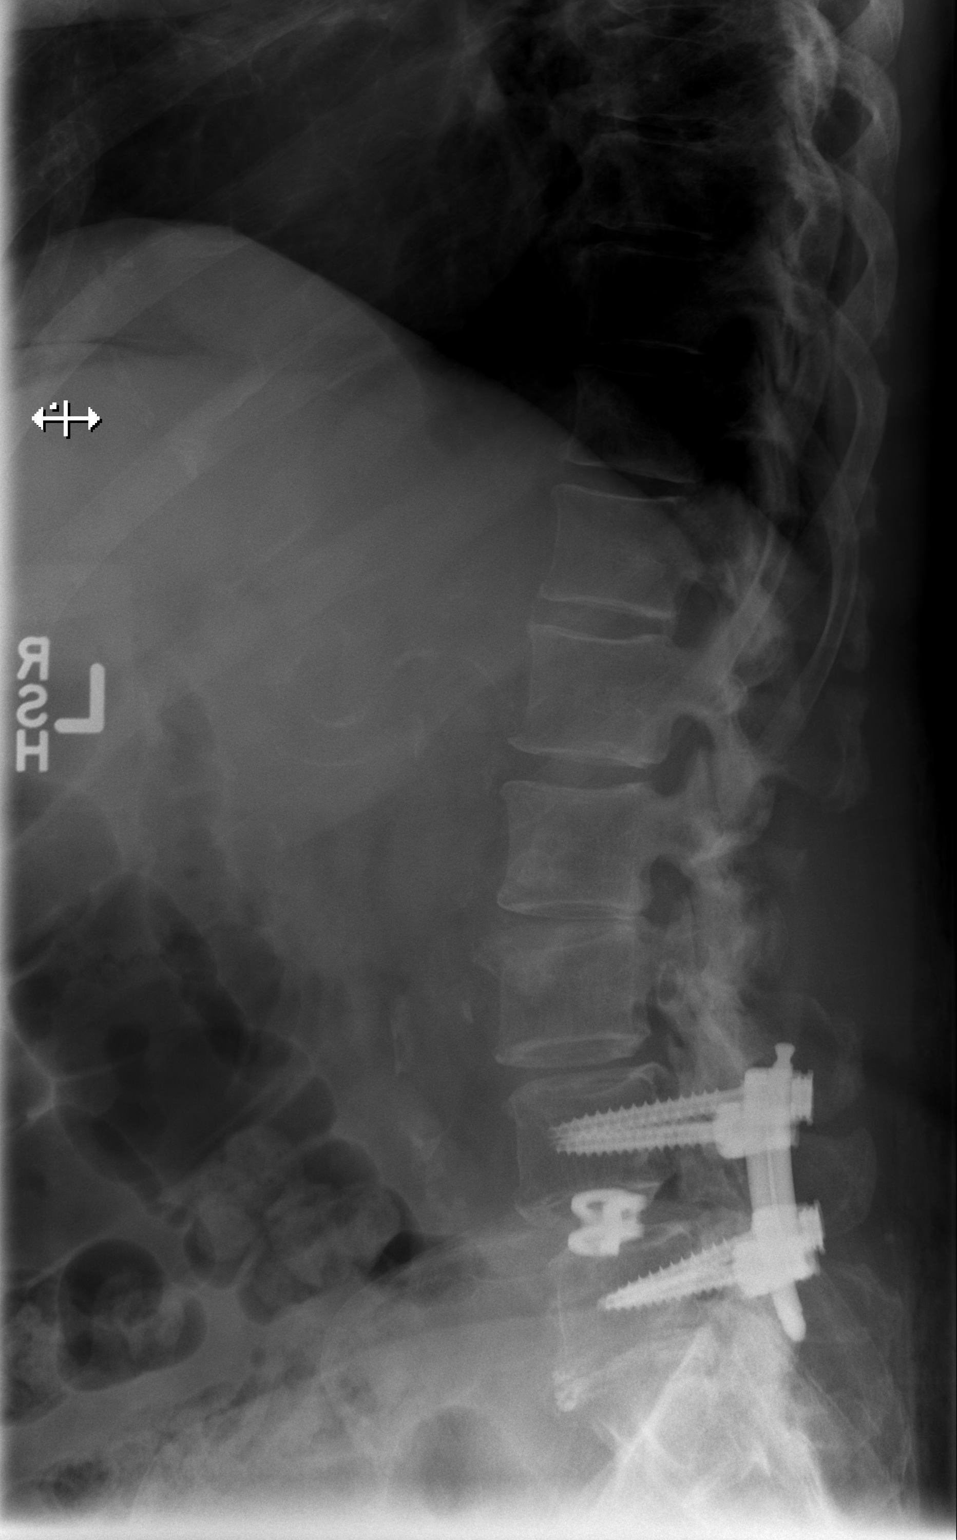

[t lumbar l-5 s-1 spot]
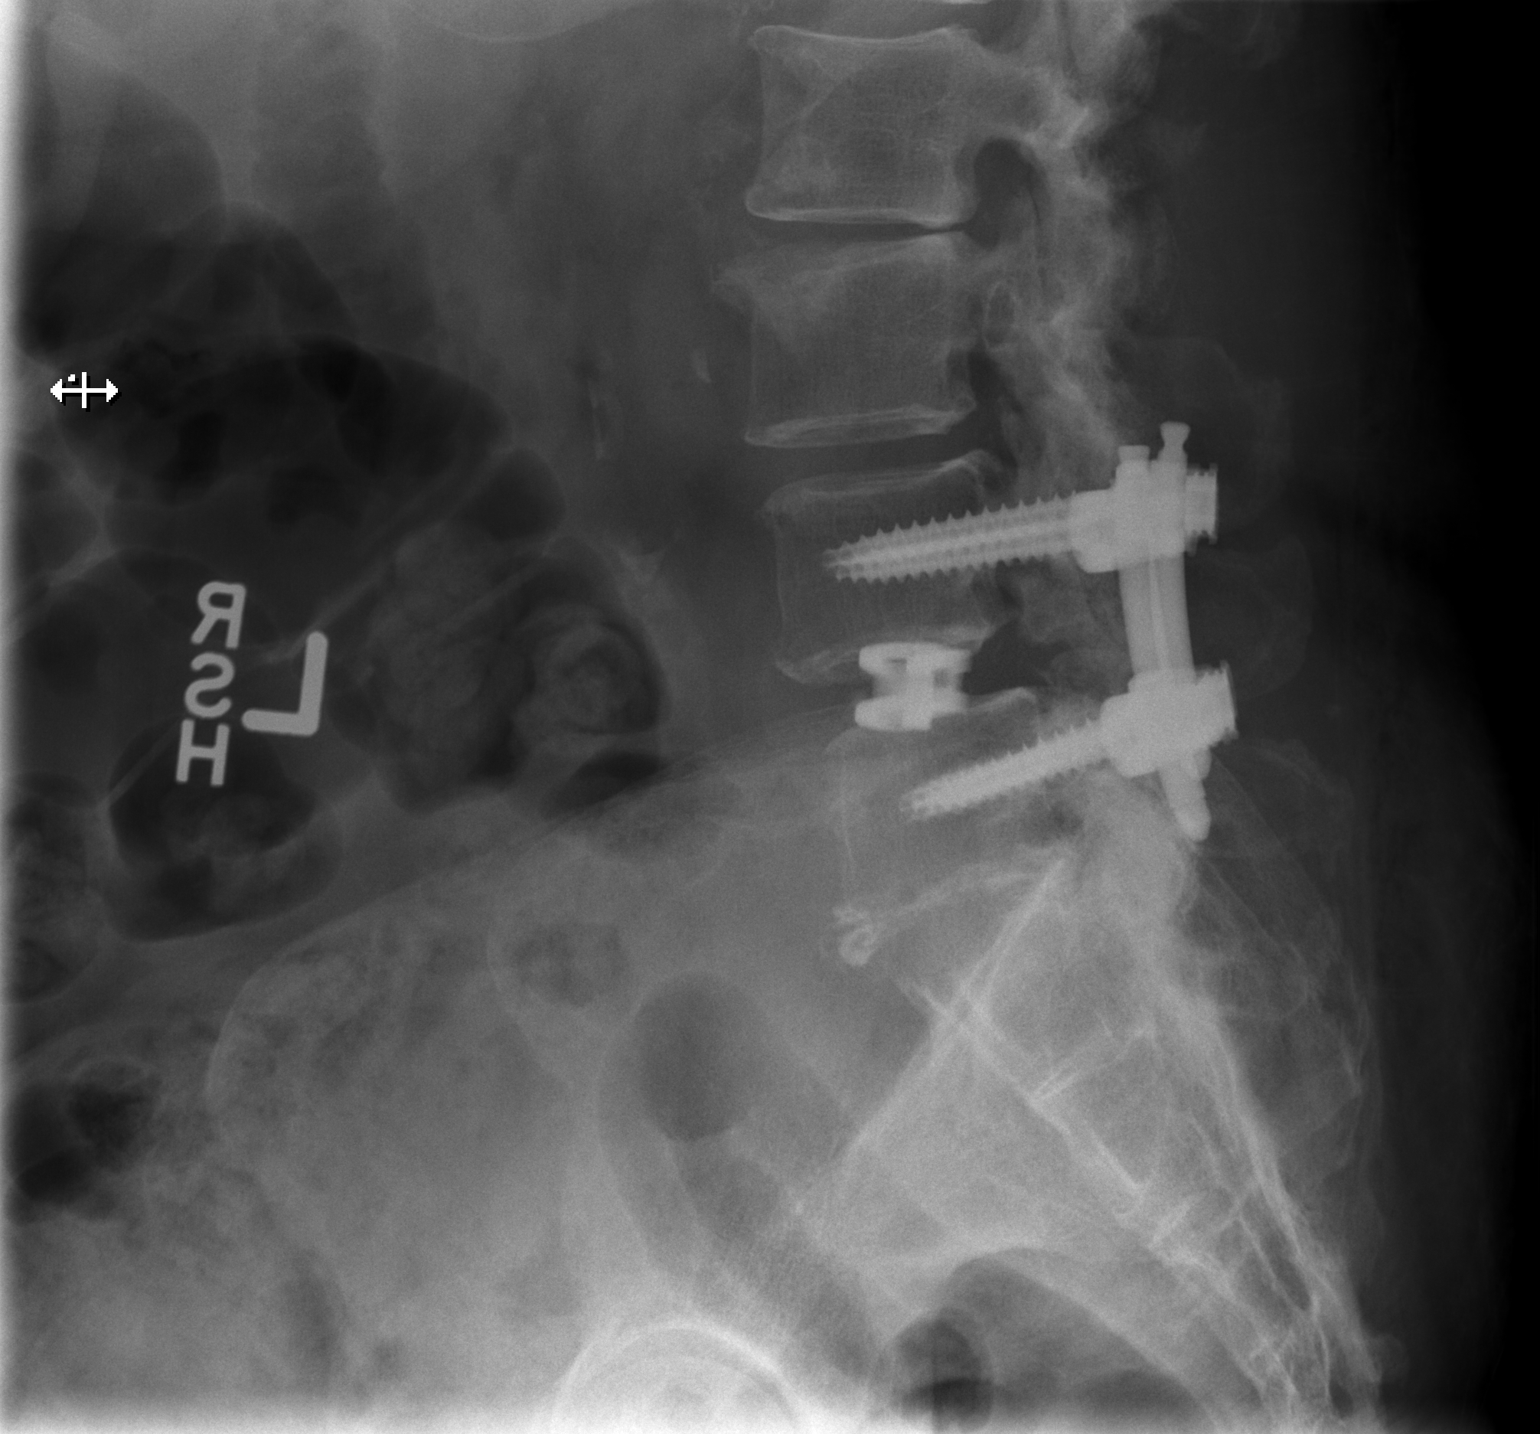

[3 of 3 positions shown; findings below may reference images not displayed]

FINDINGS: The fusion appliances appear to be in stable position. The fourth
and fifth vertebral bodies remain normally aligned. The other lumbar
vertebral bodies are normal in height and alignment. There is old
mild anterior superior cortical depression of L3.
IMPRESSION: No acute bony abnormality of the lumbar spine is observed. The
fusion appliances appear to be in stable position.
